# Patient Record
Sex: Female | Born: 1970
Health system: Southern US, Community
[De-identification: ages and names within clinical notes are randomized; demographics above are authoritative.]

## PROBLEM LIST (undated history)

## (undated) DIAGNOSIS — I10 Essential (primary) hypertension: Secondary | ICD-10-CM

## (undated) DIAGNOSIS — K219 Gastro-esophageal reflux disease without esophagitis: Secondary | ICD-10-CM

## (undated) DIAGNOSIS — R55 Syncope and collapse: Secondary | ICD-10-CM

## (undated) HISTORY — PX: OTHER SURGICAL HISTORY: SHX169

## (undated) HISTORY — PX: TENDON REPAIR: SHX5111

## (undated) HISTORY — DX: Syncope and collapse: R55

## (undated) HISTORY — PX: FINGER SURGERY: SHX640

## (undated) HISTORY — DX: Gastro-esophageal reflux disease without esophagitis: K21.9

---

## 2010-07-19 ENCOUNTER — Ambulatory Visit
Admission: RE | Admit: 2010-07-19 | Discharge: 2010-07-19 | Payer: Self-pay | Source: Home / Self Care | Admitting: Emergency Medicine

## 2010-07-19 DIAGNOSIS — K219 Gastro-esophageal reflux disease without esophagitis: Secondary | ICD-10-CM

## 2010-08-29 NOTE — Assessment & Plan Note (Signed)
Summary: SORE THROAT,COUGH/TJ   Vital Signs:  Patient Profile:   40 Years Old Female CC:      Cold & URI symptoms Height:     64 inches Weight:      182.50 pounds O2 Sat:      96 % O2 treatment:    Room Air Temp:     99.5 degrees F oral Pulse rate:   98 / minute Resp:     16 per minute BP sitting:   141 / 82  (left arm) Cuff size:   regular  Vitals Entered By: Clemens Catholic LPN (July 19, 2010 7:44 PM)                  Updated Prior Medication List: PRILOSEC 20 MG CPDR (OMEPRAZOLE)  TRIAMINIC COUGH/RUNNY NOSE 12.5 MG STRP (DIPHENHYDRAMINE HCL)   Current Allergies: No known allergies History of Present Illness History from: patient Chief Complaint: Cold & URI symptoms History of Present Illness: 40 Years Old Female complains of onset of cold symptoms for 3-4 days.  Tanya [name] has been using her Zantac and Triaminic which isn't helping. Usually when her throat hurts, it's from her GERD and her Zantac + antihistamine makes it better.  But this is getting worse and she has URI symptoms as well. + sore throat (first and worse symptom) + cough No pleuritic pain + mild wheezing + nasal congestion + post-nasal drainage No sinus pain/pressure No chest congestion No itchy/red eyes No earache No hemoptysis No SOB No chills/sweats + fever No nausea No vomiting No abdominal pain No diarrhea No skin rashes No fatigue No myalgias No headache   REVIEW OF SYSTEMS Constitutional Symptoms       Complains of fever, chills, and night sweats.     Denies weight loss, weight gain, and fatigue.  Eyes       Denies change in vision, eye pain, eye discharge, glasses, contact lenses, and eye surgery. Ear/Nose/Throat/Mouth       Complains of frequent runny nose, sinus problems, sore throat, and hoarseness.      Denies hearing loss/aids, change in hearing, ear pain, ear discharge, dizziness, frequent nose bleeds, and tooth pain or bleeding.  Respiratory       Denies dry cough,  productive cough, wheezing, shortness of breath, asthma, bronchitis, and emphysema/COPD.  Cardiovascular       Denies murmurs, chest pain, and tires easily with exhertion.    Gastrointestinal       Denies stomach pain, nausea/vomiting, diarrhea, constipation, blood in bowel movements, and indigestion. Genitourniary       Denies painful urination, kidney stones, and loss of urinary control. Neurological       Denies paralysis, seizures, and fainting/blackouts. Musculoskeletal       Denies muscle pain, joint pain, joint stiffness, decreased range of motion, redness, swelling, muscle weakness, and gout.  Skin       Denies bruising, unusual mles/lumps or sores, and hair/skin or nail changes.  Psych       Denies mood changes, temper/anger issues, anxiety/stress, speech problems, depression, and sleep problems. Other Comments: pt c/o sore throat, hoarseness, cough x 3 days. she has taken OTC Mucinex with no relief. she states it feels like her throat is "on fire".   Past History:  Past Medical History: GERD  Past Surgical History: Rt 4th digit tendon repair  Family History: MI, parkinson's dz, prostate CA  Social History: Never Smoked Alcohol use-no Drug use-no Smoking Status:  never Drug Use:  no  Physical Exam General appearance: well developed, well nourished, no acute distress Ears: normal, no lesions or deformities Nasal: clear discharge Oral/Pharynx: tongue normal, posterior pharynx without erythema or exudate, mild clear post nasal drip Chest/Lungs: no rales, wheezes, or rhonchi bilateral, breath sounds equal without effort Heart: regular rate and  rhythm, no murmur Skin: no obvious rashes or lesions MSE: oriented to time, place, and person Assessment New Problems: GERD (ICD-530.81) UPPER RESPIRATORY INFECTION, ACUTE (ICD-465.9)  rapid strep negative  Patient Education: Patient and/or caregiver instructed in the following: rest, fluids, Tylenol prn, Ibuprofen  prn.  Plan New Medications/Changes: AMOXICILLIN 875 MG TABS (AMOXICILLIN) 1 by mouth two times a day for 7 days  #14 x 0, 07/19/2010, Hoyt Koch MD  New Orders: New Patient Level III [phone] Solumedrol up to 125mg  [J2930] Admin of Therapeutic Inj  intramuscular or subcutaneous [96372] Planning Comments:   1)  Take the prescribed antibiotic as instructed. Hold for a few days since this illness usually peaks at day #4 2)  Use nasal saline solution (over the counter) at least 3 times a day. 3)  Use over the counter decongestants like Zyrtec-D every 12 hours as needed to help with congestion. 4)  Can take tylenol every 6 hours or motrin every 8 hours for pain or fever. 5)  Follow up with your primary doctor  if no improvement in 5-7 days, sooner if increasing pain, fever, or new symptoms.   Continue your GERD meds as needed.    The patient and/or caregiver has been counseled thoroughly with regard to medications prescribed including dosage, schedule, interactions, rationale for use, and possible side effects and they verbalize understanding.  Diagnoses and expected course of recovery discussed and will return if not improved as expected or if the condition worsens. Patient and/or caregiver verbalized understanding.  Prescriptions: AMOXICILLIN 875 MG TABS (AMOXICILLIN) 1 by mouth two times a day for 7 days  #14 x 0   Entered and Authorized by:   Hoyt Koch MD   Signed by:   Hoyt Koch MD on 07/19/2010   Method used:   Print then Give to Patient   RxID:   [id number]   Medication Administration  Injection # 1:    Medication: Solumedrol up to 125mg     Diagnosis: UPPER RESPIRATORY INFECTION, ACUTE (ICD-465.9)    Route: IM    Site: RUOQ gluteus    Exp Date: 12/27/2012    Lot #: [id number]    Mfr: Pharmacia    Patient tolerated injection without complications    Given by: Clemens Catholic LPN (July 19, 2010 8:06 PM)  Orders Added: 1)  New Patient Level III  [99203] 2)  Solumedrol up to 125mg  [J2930] 3)  Admin of Therapeutic Inj  intramuscular or subcutaneous [96372]    Laboratory Results  Date/Time Received: July 19, 2010 7:56 PM  Date/Time Reported: July 19, 2010 7:56 PM   Other Tests  Rapid Strep: negative  Kit Test Internal QC: Negative   (Normal Range: Negative)

## 2011-09-02 LAB — BASIC METABOLIC PANEL
BUN: 17 mg/dL (ref 4–21)
Glucose: 88 mg/dL
Potassium: 4 mmol/L (ref 3.4–5.3)
Sodium: 136 mmol/L — AB (ref 137–147)

## 2011-09-02 LAB — LIPID PANEL: Triglycerides: 117 mg/dL (ref 40–160)

## 2011-09-02 LAB — HEPATIC FUNCTION PANEL: Bilirubin, Total: 0.7 mg/dL

## 2012-07-14 ENCOUNTER — Encounter: Payer: Self-pay | Admitting: Physician Assistant

## 2012-07-14 ENCOUNTER — Ambulatory Visit (INDEPENDENT_AMBULATORY_CARE_PROVIDER_SITE_OTHER): Payer: 59 | Admitting: Physician Assistant

## 2012-07-14 VITALS — BP 131/79 | HR 74 | Temp 98.4°F | Resp 18 | Wt 187.0 lb

## 2012-07-14 DIAGNOSIS — Q849 Congenital malformation of integument, unspecified: Secondary | ICD-10-CM

## 2012-07-14 DIAGNOSIS — L989 Disorder of the skin and subcutaneous tissue, unspecified: Secondary | ICD-10-CM

## 2012-07-14 DIAGNOSIS — J309 Allergic rhinitis, unspecified: Secondary | ICD-10-CM

## 2012-07-14 DIAGNOSIS — L259 Unspecified contact dermatitis, unspecified cause: Secondary | ICD-10-CM

## 2012-07-14 DIAGNOSIS — J302 Other seasonal allergic rhinitis: Secondary | ICD-10-CM

## 2012-07-14 DIAGNOSIS — L309 Dermatitis, unspecified: Secondary | ICD-10-CM

## 2012-07-14 DIAGNOSIS — R238 Other skin changes: Secondary | ICD-10-CM

## 2012-07-14 DIAGNOSIS — L988 Other specified disorders of the skin and subcutaneous tissue: Secondary | ICD-10-CM

## 2012-07-14 DIAGNOSIS — M2550 Pain in unspecified joint: Secondary | ICD-10-CM

## 2012-07-14 DIAGNOSIS — M2560 Stiffness of unspecified joint, not elsewhere classified: Secondary | ICD-10-CM

## 2012-07-14 MED ORDER — CLOBETASOL PROPIONATE 0.05 % EX GEL: 1.0000 "application " | Freq: Two times a day (BID) | CUTANEOUS | Status: DC

## 2012-07-14 NOTE — Patient Instructions (Addendum)
Ibuprofen for hands and feet inflammation.   Clobetasol for spot on head. Continue using shampoo try to decrease back to twice a week.

## 2012-07-16 ENCOUNTER — Telehealth: Payer: Self-pay | Admitting: Physician Assistant

## 2012-07-16 DIAGNOSIS — L309 Dermatitis, unspecified: Secondary | ICD-10-CM | POA: Insufficient documentation

## 2012-07-16 DIAGNOSIS — J302 Other seasonal allergic rhinitis: Secondary | ICD-10-CM | POA: Insufficient documentation

## 2012-07-16 NOTE — Progress Notes (Signed)
Subjective:    Patient ID: Tanya [name], female    DOB: 04/13/1971, 41 y.o.   MRN: [id number]  HPI Patient is a 41 year old female who presents to the clinic to establish care today. Past medical history is positive for GERD and seasonal allergies.  Patient is controlled on her acid reflux and uses omeprazole 20 mg daily for heartburn relief.  Patient does have a few new issues to address today. She has a hard and not on her for head that has been in normal by other healthcare professionals for the last 2 months. The knot does not hurt and doesn't seem to be getting bigger. She does not remember this as of 2 months ago. She would just like a piece of mind knowing everything is all right.  She also complains of scaly patches on the back of her head that has been ongoing for the last year. She has been using a tar shampoo is good for psoriasis and seborrheic dermatitis. It has helped a lot with itching and symptoms. She's only stenoses the shampoo twice a week but she's having to use it daily in order to have symptomatic relief. She's never been given any prescription for these patches however over-the-counter hydrocortisone 1% cream tends to help.  She also has a lesion on her left gluteus maximus. She is noticed a spot for about 2 weeks now. It does not hurt or itch. She has not done anything to make better. Nothing seems to make worse. It appears to be going away on its own. She notices no edema or warmth coming from that area. She denies any fever, chills, muscle aches.  She's also having some hand stiffness and joint pain of bilateral hands. This has been ongoing over the course of a year. It has thickened worse in the last couple of months. She notices that stiffness is worse first thing in the morning and gets better throughout the day. She her preventative for RA. She takes Aleve occasionally for pain.  Patient's next mammogram is scheduled for later this month. Patient is aware she has not  had fasting labs in over a year and will followup later this month. Patient does not smoke and drinks occasionally.  Patient does have a family history positive for prostate cancer, heart attack, Parkinson's disease, stroke and high blood pressure.   Review of Systems     Objective:   Physical Exam  Constitutional: She is oriented to person, place, and time. She appears well-developed and well-nourished.  HENT:  Head: Normocephalic and atraumatic.  Cardiovascular: Normal rate, regular rhythm and normal heart sounds.   Pulmonary/Chest: Effort normal and breath sounds normal.  Musculoskeletal:       Normal range of motion of bilateral hands. I did not see any particular joint swelling bilaterally. Strength 5 out of 5.  Neurological: She is alert and oriented to person, place, and time.  Skin:          Harden knot about the size of a grape center of forehead.  Psychiatric: She has a normal mood and affect. Her behavior is normal.          Assessment & Plan:  GERD-we'll refill omeprazole. Continue to keep a GERD diet.  Abnormality of skin, not all 4 head-call down to radiology and they suggested a CT of the head without contrast for evaluation. Will call patient with that information. Reassured patient that I did not think findings were significant and felt like a bony cyst.  Dermatitis-it  may be because lesions have been treated but they do not appear typical of psoriasis. Will treat like atopic dermatitis. With steroid cream working I suggest we continue with that treatment and give her something a little stronger. Gave him another to apply twice a day for up to 2 weeks at a time and then as needed for skin irritation and rash. I did inform patient to continue using tar shampoo but tried to decrease down to twice a week. If worsening or not improving call office.  Lesion-days Bactroban in office to apply up to 3 times a day. Encouraged patient to keep area clean. Call if lesion  worsening or signs of infection are present.  Joint pain and stiffness-I suggested to patient that we get an RA rheumatoid panel. Patient did not want to do this testing at this time. She didn't want to give more time. I encouraged her to take Aleve or frequently for pain control. If she starts to notice a particular joint swelling we could do panel and get x-rays of hands. I did say that she could try glucosamine/chondroitin along with facial that could potentially help with some joint pain and stiffness.  Spent 45 minutes with patient in greater than 50% of the time was spent counseling of treatment plan and explaining diagnosis.

## 2012-07-16 NOTE — Patient Instructions (Addendum)
Will get CT of head to evaluate knot on forehead.   Refilled Omeprazole.  Try aleve regularly for hand pain. Call if want testing for RA.   Follow up in next 3 months for fasting labs.

## 2012-07-22 ENCOUNTER — Telehealth: Payer: Self-pay | Admitting: *Deleted

## 2012-07-22 DIAGNOSIS — Q849 Congenital malformation of integument, unspecified: Secondary | ICD-10-CM

## 2012-07-22 NOTE — Telephone Encounter (Signed)
Carolyn from [employer] called and states the order for the CT on this pt needs to be changed to w/wo

## 2012-08-19 ENCOUNTER — Encounter: Payer: Self-pay | Admitting: *Deleted

## 2012-09-15 ENCOUNTER — Ambulatory Visit (HOSPITAL_BASED_OUTPATIENT_CLINIC_OR_DEPARTMENT_OTHER): Payer: 59

## 2012-09-15 ENCOUNTER — Ambulatory Visit (HOSPITAL_BASED_OUTPATIENT_CLINIC_OR_DEPARTMENT_OTHER)
Admission: RE | Admit: 2012-09-15 | Discharge: 2012-09-15 | Disposition: A | Discharge status: Home / Self Care | Payer: 59 | Source: Ambulatory Visit | Attending: Physician Assistant | Admitting: Physician Assistant

## 2012-09-15 DIAGNOSIS — L989 Disorder of the skin and subcutaneous tissue, unspecified: Secondary | ICD-10-CM

## 2012-09-15 DIAGNOSIS — R220 Localized swelling, mass and lump, head: Secondary | ICD-10-CM | POA: Insufficient documentation

## 2012-10-06 ENCOUNTER — Ambulatory Visit (INDEPENDENT_AMBULATORY_CARE_PROVIDER_SITE_OTHER): Payer: 59 | Admitting: Physician Assistant

## 2012-10-06 ENCOUNTER — Encounter: Payer: Self-pay | Admitting: Physician Assistant

## 2012-10-06 VITALS — BP 132/78 | HR 69 | Wt 187.0 lb

## 2012-10-06 DIAGNOSIS — R030 Elevated blood-pressure reading, without diagnosis of hypertension: Secondary | ICD-10-CM

## 2012-10-06 DIAGNOSIS — M79643 Pain in unspecified hand: Secondary | ICD-10-CM

## 2012-10-06 DIAGNOSIS — M25549 Pain in joints of unspecified hand: Secondary | ICD-10-CM

## 2012-10-06 DIAGNOSIS — R21 Rash and other nonspecific skin eruption: Secondary | ICD-10-CM

## 2012-10-06 DIAGNOSIS — Z13220 Encounter for screening for lipoid disorders: Secondary | ICD-10-CM

## 2012-10-06 DIAGNOSIS — Z789 Other specified health status: Secondary | ICD-10-CM

## 2012-10-06 DIAGNOSIS — Z131 Encounter for screening for diabetes mellitus: Secondary | ICD-10-CM

## 2012-10-06 LAB — LIPID PANEL
Cholesterol: 216 mg/dL — ABNORMAL HIGH (ref 0–200)
HDL: 50 mg/dL (ref >39)
Total CHOL/HDL Ratio: 4.3 Ratio
VLDL: 30 mg/dL (ref 0–40)

## 2012-10-06 LAB — COMPREHENSIVE METABOLIC PANEL
ALT: <8 U/L (ref 0–35)
AST: 12 U/L (ref 0–37)
Alkaline Phosphatase: 60 U/L (ref 39–117)
Creat: 0.96 mg/dL (ref 0.50–1.10)
Sodium: 139 mEq/L (ref 135–145)
Total Bilirubin: 0.4 mg/dL (ref 0.3–1.2)

## 2012-10-06 LAB — CBC
MCH: 27.4 pg (ref 26.0–34.0)
MCHC: 33.4 g/dL (ref 30.0–36.0)
MCV: 82 fL (ref 78.0–100.0)
Platelets: 220 10*3/uL (ref 150–400)
RDW: 14.8 % (ref 11.5–15.5)

## 2012-10-06 NOTE — Patient Instructions (Addendum)

## 2012-10-06 NOTE — Progress Notes (Signed)
  Subjective:    Patient ID: Tanya [name], female    DOB: 07-Dec-1970, 42 y.o.   MRN: [id number]  HPI Patient presents to the clinic to follow up on last visit.   She does have elevated BP today will recheck. Denies any hx of HTN. Does not watch diet and does not exercise. She has gained some weight over the last year. Denies any CP, palpitations, SOB, HA's or blurred vision.   RASH on left buttocks crease. It is a pinpoint scaly lesions. It itches but not painful. It has not increased in size. It does not drain. She has not done anything to make better.   Joint pain/stiffiness in hands is much better. After coming in she rarely has symptoms. If she does she take ibuprofen and resolves.      Review of Systems     Objective:   Physical Exam  Constitutional: She is oriented to person, place, and time. She appears well-developed and well-nourished.  HENT:  Head: Normocephalic and atraumatic.  Eyes: Conjunctivae are normal.  Cardiovascular: Normal rate, regular rhythm and normal heart sounds.   Pulmonary/Chest: Effort normal and breath sounds normal.  Neurological: She is alert and oriented to person, place, and time.  Skin: Skin is warm and dry.  Pinpoint red fine scaly lesion about 1mm on left buttocks crease.   Psychiatric: She has a normal mood and affect. Her behavior is normal.          Assessment & Plan:  Elevated blood pressure reading- Rechecked and was 132/78. Discussed low salt diet and gave handout. Regular exercise and weight loss could also help prevent HTN.  RASH- I originally thought it was a mild case of folliculitis and gave batroban. It almost seems like today that it could be a actinic keratosis. Offered to freeze today but pt declined. Also told her to use temovate on area and see if it might be some atopic dermatitis. Does not seem worrisome. Discussed to keep a watch for changes if AK could turn into Basal Cell. Did refill cream for scalp dermatitis.  Joint  stiffness of hands- REsolved with occasional naproxen. Declined further work up.   Gave lab slip for screening labs.

## 2012-10-13 ENCOUNTER — Telehealth: Payer: Self-pay | Admitting: *Deleted

## 2012-10-13 NOTE — Telephone Encounter (Signed)
Pt notified of labs

## 2012-12-21 NOTE — Telephone Encounter (Signed)
.  nn

## 2012-12-26 ENCOUNTER — Encounter: Payer: Self-pay | Admitting: *Deleted

## 2012-12-26 ENCOUNTER — Emergency Department
Admission: EM | Admit: 2012-12-26 | Discharge: 2012-12-26 | Disposition: A | Discharge status: Home / Self Care | Payer: 59 | Source: Home / Self Care | Attending: Family Medicine | Admitting: Family Medicine

## 2012-12-26 DIAGNOSIS — J029 Acute pharyngitis, unspecified: Secondary | ICD-10-CM

## 2012-12-26 LAB — POCT RAPID STREP A (OFFICE): Rapid Strep A Screen: NEGATIVE

## 2012-12-26 MED ORDER — AZITHROMYCIN 250 MG PO TABS: ORAL_TABLET | ORAL | Status: DC

## 2012-12-26 MED ORDER — BENZONATATE 200 MG PO CAPS: 200.0000 mg | ORAL_CAPSULE | Freq: Every day | ORAL | Status: DC

## 2012-12-26 NOTE — ED Notes (Signed)
Patient c/o sore throat x 3-4 days and c/o left ear pain, fever, chills. Patient has tried OTC Mucinex, Hall cough drops, and Chloraseptic throat lozenges with no relief.

## 2012-12-26 NOTE — ED Provider Notes (Signed)
History     CSN: [id number]  Arrival date & time 12/26/12  1615   First MD Initiated Contact with Patient 12/26/12 1658      Chief Complaint  Patient presents with  . Sore Throat      HPI Comments: Patient complains of approximately 4 day history of gradually progressive URI symptoms beginning with a mild sore throat (now improved), followed by nasal congestion.  A cough started 2 days ago.  Complains of fatigue and initial myalgias.  Cough is now worse at night and generally non-productive during the day.  There has been no pleuritic pain, shortness of breath, or wheezes.   She has had chills.  The history is provided by the patient.    Past Medical History  Diagnosis Date  . GERD (gastroesophageal reflux disease)     Past Surgical History  Procedure Laterality Date  . Tendon repair      RT 4th digit    Family History  Problem Relation Age of Onset  . Cancer Father     prostate  . Heart attack Maternal Grandmother   . Hypertension Maternal Grandmother   . Parkinson's disease Maternal Grandfather     History  Substance Use Topics  . Smoking status: Never Smoker   . Smokeless tobacco: Never Used  . Alcohol Use: Yes    OB History   Grav Para Term Preterm Abortions TAB SAB Ect Mult Living                  Review of Systems + sore throat + cough No pleuritic pain No wheezing No nasal congestion + post-nasal drainage No sinus pain/pressure No itchy/red eyes ? earache No hemoptysis No SOB No fever, + chills No nausea No vomiting No abdominal pain No diarrhea No urinary symptoms No skin rashes + fatigue + myalgias No headache    Allergies  Review of patient's allergies indicates no known allergies.  Home Medications   Current Outpatient Rx  Name  Route  Sig  Dispense  Refill  . azithromycin (ZITHROMAX Z-PAK) 250 MG tablet      Take 2 tabs today; then begin one tab once daily for 4 more days. (Rx void after 01/02/13)   6 each   0   .  benzonatate (TESSALON) 200 MG capsule   Oral   Take 1 capsule (200 mg total) by mouth at bedtime.   12 capsule   0   . brompheniramine-pseudoephedrine (DIMETAPP) 1-15 MG/5ML ELIX   Oral   Take by mouth 2 (two) times daily as needed.         . calcium carbonate (TUMS EX) 750 MG chewable tablet   Oral   Chew 1 tablet by mouth as needed.         . clobetasol (TEMOVATE) 0.05 % GEL   Topical   Apply 1 application topically 2 (two) times daily. For no longer than 2 weeks at a time.   1 each   1   . naproxen sodium (ANAPROX) 220 MG tablet   Oral   Take 220 mg by mouth as needed.         Marland Kitchen omeprazole (PRILOSEC) 20 MG capsule   Oral   Take 20 mg by mouth as needed.           BP 113/74  Pulse 84  Temp(Src) 98.1 F (36.7 C) (Oral)  Resp 16  Ht 5\' 4"  (1.626 m)  Wt 189 lb 12.8 oz (86.093 kg)  BMI  32.56 kg/m2  SpO2 99%  Physical Exam Nursing notes and Vital Signs reviewed. Appearance:  Patient appears stated age, and in no acute distress.  Patient is obese (BMI 32.6) Eyes:  Pupils are equal, round, and reactive to light and accomodation.  Extraocular movement is intact.  Conjunctivae are not inflamed  Ears:  Canals normal.  Tympanic membranes normal.  Nose:  Mildly congested turbinates.  No sinus tenderness.   Pharynx:  Minimal erythema Neck:  Supple.  Slightly tender shotty posterior nodes are palpated bilaterally  Lungs:  Clear to auscultation.  Breath sounds are equal.  Heart:  Regular rate and rhythm without murmurs, rubs, or gallops.  Abdomen:  Nontender without masses or hepatosplenomegaly.  Bowel sounds are present.  No CVA or flank tenderness.  Extremities:  No edema.  No calf tenderness Skin:  No rash present.   ED Course  Procedures  none  Labs Reviewed  STREP A DNA PROBE  POCT RAPID STREP A (OFFICE) negative      1. Acute pharyngitis; suspect early viral URI       MDM  There is no evidence of bacterial infection today.   Treat symptomatically  for now.  Throat culture pending. Take Mucinex D (guaifenesin with decongestant) twice daily for congestion.  Increase fluid intake, rest. If sinus congestion increases, may use Afrin nasal spray (or generic oxymetazoline) twice daily for about 5 days.  Also recommend using saline nasal spray several times daily and saline nasal irrigation (AYR is a common brand) Stop all antihistamines for now, and other non-prescription cough/cold preparations. May take Aleve, 2 tabs twice daily with food for sore throat, fever, etc. Begin Benzonatate if cough becomes worse at night. Begin Azithromycin if throat culture positive, if not improving about one week, or if persistent fever develops (Given a prescription to hold, with an expiration date)  Follow-up with family doctor if not improving about10 days.         Lattie Haw, MD 12/28/12 1028

## 2013-10-04 ENCOUNTER — Encounter: Payer: Self-pay | Admitting: Family Medicine

## 2013-10-05 ENCOUNTER — Ambulatory Visit (INDEPENDENT_AMBULATORY_CARE_PROVIDER_SITE_OTHER): Payer: 59 | Admitting: Physician Assistant

## 2013-10-05 ENCOUNTER — Encounter: Payer: Self-pay | Admitting: Physician Assistant

## 2013-10-05 VITALS — BP 130/82 | HR 70 | Wt 197.0 lb

## 2013-10-05 DIAGNOSIS — H16139 Photokeratitis, unspecified eye: Secondary | ICD-10-CM

## 2013-10-05 DIAGNOSIS — L570 Actinic keratosis: Secondary | ICD-10-CM

## 2013-10-05 NOTE — Patient Instructions (Signed)
Actinic Keratosis  Actinic keratosis is a precancerous growth on the skin. This means it could develop into skin cancer if it is not treated. About 1% of actinic keratoses turn into skin cancer within a year. It is important to have all such growths removed to prevent them from developing into skin cancer.  CAUSES   Actinic keratosis is caused by getting too much ultraviolet (UV) radiation from the sun or other UV light sources.  RISK FACTORS  Factors that increase your chances of getting actinic keratosis include:   Having light-colored skin and blue eyes.   Having blonde or red hair.   Spending a lot of time in the sun.   Age. The risk of actinic keratosis increases with age.  SYMPTOMS   Actinic keratosis growths look like scaly, rough spots of skin. They can be as small as a pinhead or as big as a quarter. They may itch, hurt, or feel sensitive. Sometimes there is a little tag of pink or gray skin growing off them. In some cases, actinic keratoses are easier felt than seen. They do not go away with the use of moisturizing lotions or creams. Actinic keratoses appear most often on areas of skin that get a lot of sun exposure. These areas include the:   Scalp.   Face.   Ears.   Lips.   Upper back.   Backs of the hands.   Forearms.  DIAGNOSIS   Your caregiver can usually tell what is wrong by performing a physical exam. A tissue sample (biopsy) may also be taken and examined under a microscope.  TREATMENT   Actinic keratosis can be treated several ways. Most treatments can be done in your caregiver's office. Treatment options may include:   Curettage. A tool is used to gently scrape off the growth.   Cryosurgery. Liquid nitrogen is applied to the growth to freeze it. The growth eventually falls off the skin.   Medicated creams, such as 5-fluorouracil or imiquimod. The medicine destroys the cells in the growth.    Chemical peels. Chemicals are applied to the growth and the outer layers of skin are peeled off.   Photodynamic therapy. A drug that makes your skin more sensitive to light is applied to the skin. A strong, blue light is aimed at the skin and destroys the growth.  PREVENTION   To prevent future sun damage:   Try to avoid the sun between 10:00 a.m. and 4:00 p.m. when it is the strongest.   Use a sunscreen or sunblock with SPF 30 or greater.   Apply sunscreen at least 30 minutes before exposure to the sun.   Always wear protective hats, clothing, and sunglasses with UV protection.   Avoid medicines, herbs, and foods that increase your sensitivity to sunlight.   Avoid tanning beds.  HOME CARE INSTRUCTIONS    If your skin was covered with a bandage, change and remove the bandage as directed by your caregiver.   Keep the treated area dry as directed by your caregiver.   Apply any creams as prescribed by your caregiver. Follow the directions carefully.   Check your skin regularly for any changes.   Visit a skin doctor (dermatologist) every year for a skin exam.  SEEK MEDICAL CARE IF:    Your skin does not heal and becomes irritated, red, or bleeds.   You notice any changes or new growths on your skin.  Document Released: 10/10/2008 Document Revised: 10/06/2011 Document Reviewed: 08/25/2011  [employer] Patient Information 

## 2013-10-07 NOTE — Progress Notes (Signed)
   Subjective:    Patient ID: Tanya [name], female    DOB: 05-28-1971, 43 y.o.   MRN: [id number]  HPI  patient is a 43 year old female who presents to the clinic with a wide crusty-like lesion on the middle of her chest. She does notice it for couple months and does not seem to go away. Does not itch it does not hurt. She's tried a little bit hydrocortisone cream that has not helped. She feels like she might to scratch it off but it does not seem to want to home off. She does admit to a lot of sun exposure throughout her years of life. She is fair skinned.   Review of Systems     Objective:   Physical Exam  Pulmonary/Chest:            Assessment & Plan:  Actinic keratosis-patient is very fair skinned woman in lesion is in a very sun exposed area. Physical exam was suggestive of AK. Discussed with patient the diagnosis in pre-cancerous lesion. We decided on removal with cryotherapy today. Patient was advised to come in to office if she develops any more lesion similar to this. Cryotherapy Procedure Note  Pre-operative Diagnosis: Actinic keratosis  Post-operative Diagnosis: Actinic keratosis  Locations: middle chest  Indications: pre-cancerous  Anesthesia: none  Procedure Details  History of allergy to iodine: no. Pacemaker? no.  Patient informed of risks (permanent scarring, infection, light or dark discoloration, bleeding, infection, weakness, numbness and recurrence of the lesion) and benefits of the procedure and verbal informed consent obtained.  The areas are treated with liquid nitrogen therapy, frozen until ice ball extended 2 mm beyond lesion, allowed to thaw, and treated again. The patient tolerated procedure well.  The patient was instructed on post-op care, warned that there may be blister formation, redness and pain. Recommend OTC analgesia as needed for pain.  Condition: Stable  Complications: none.  Plan: 1. Instructed to keep the area dry and covered  for 24-48h and clean thereafter. 2. Warning signs of infection were reviewed.   3. Recommended that the patient use OTC acetaminophen as needed for pain.

## 2014-04-14 ENCOUNTER — Encounter: Payer: Self-pay | Admitting: Physician Assistant

## 2014-04-14 ENCOUNTER — Ambulatory Visit (INDEPENDENT_AMBULATORY_CARE_PROVIDER_SITE_OTHER): Payer: 59 | Admitting: Physician Assistant

## 2014-04-14 ENCOUNTER — Ambulatory Visit (INDEPENDENT_AMBULATORY_CARE_PROVIDER_SITE_OTHER): Payer: 59

## 2014-04-14 VITALS — BP 128/67 | HR 83 | Ht 64.0 in | Wt 187.0 lb

## 2014-04-14 DIAGNOSIS — M6530 Trigger finger, unspecified finger: Secondary | ICD-10-CM

## 2014-04-14 DIAGNOSIS — M79609 Pain in unspecified limb: Secondary | ICD-10-CM

## 2014-04-14 DIAGNOSIS — M65312 Trigger thumb, left thumb: Secondary | ICD-10-CM

## 2014-04-14 MED ORDER — MELOXICAM 15 MG PO TABS: 15.0000 mg | ORAL_TABLET | Freq: Every day | ORAL | Status: DC

## 2014-04-14 NOTE — Progress Notes (Signed)
   Subjective:    Patient ID: Tanya [name], female    DOB: 26-Aug-1970, 43 y.o.   MRN: [id number]  HPI Pt presents to the clinic with left thumb pain and loss of strength and ROM over past 3 months. Started in July 2015. Left thumb would ache off and on. Now she cannot bend her thumb and if she does it gets caught. She works with her hands a lot. She has to work this weekend but off a few days next week.    Review of Systems  [name] other systems reviewed and are negative.      Objective:   Physical Exam  Constitutional: She appears well-developed and well-nourished.  Musculoskeletal:  No ROM at IP joint of left thumb.  Some side to side movement of MCP joint.  Pain with harden know at base of MCP joint.  No able to reproduce trigger actively but passively elicict a lot of pain and catching.           Assessment & Plan:  Trigger thumb, left- Dr. Darene [name] not in office today. Scheduled for injection on Monday. Xray ordered today. Offered to splint but pt has to work and states she will not stay in splint. Ordered mobic daily. Encouraged to ice. Gave HO.

## 2014-04-14 NOTE — Patient Instructions (Signed)
Trigger Finger Trigger finger (digital tendinitis and stenosing tenosynovitis) is a common disorder that causes an often painful catching of the fingers or thumb. It occurs as a clicking, snapping, or locking of a finger in the palm of the hand. This is caused by a problem with the tendons that flex or bend the fingers sliding smoothly through their sheaths. The condition may occur in any finger or a couple fingers at the same time.  The finger may lock with the finger curled or suddenly straighten out with a snap. This is more common in patients with rheumatoid arthritis and diabetes. Left untreated, the condition may get worse to the point where the finger becomes locked in flexion, like making a fist, or less commonly locked with the finger straightened out. CAUSES   Inflammation and scarring that lead to swelling around the tendon sheath.  Repeated or forceful movements.  Rheumatoid arthritis, an autoimmune disease that affects joints.  Gout.  Diabetes mellitus. SIGNS AND SYMPTOMS  Soreness and swelling of your finger.  A painful clicking or snapping as you bend and straighten your finger. DIAGNOSIS  Your health care provider will do a physical exam of your finger to diagnose trigger finger. TREATMENT   Splinting for 6-8 weeks may be helpful.  Nonsteroidal anti-inflammatory medicines (NSAIDs) can help to relieve the pain and inflammation.  Cortisone injections, along with splinting, may speed up recovery. Several injections may be required. Cortisone may give relief after one injection.  Surgery is another treatment that may be used if conservative treatments do not work. Surgery can be minor, without incisions (a cut does not have to be made), and can be done with a needle through the skin.  Other surgical choices involve an open procedure in which the surgeon opens the hand through a small incision and cuts the pulley so the tendon can again slide smoothly. Your hand will still  work fine. HOME CARE INSTRUCTIONS  Apply ice to the injured area, twice per day:  Put ice in a plastic bag.  Place a towel between your skin and the bag.  Leave the ice on for 20 minutes, 3-4 times a day.  Rest your hand often. MAKE SURE YOU:   Understand these instructions.  Will watch your condition.  Will get help right away if you are not doing well or get worse. Document Released: 05/03/2004 Document Revised: 03/16/2013 Document Reviewed: 12/14/2012 Exit[employer] Patient Information 2015 [employer]. This information is not intended to replace advice given to you by your health care provider. Make sure you discuss any questions you have with your health care provider.  

## 2014-04-17 ENCOUNTER — Ambulatory Visit (INDEPENDENT_AMBULATORY_CARE_PROVIDER_SITE_OTHER): Payer: 59 | Admitting: Sports Medicine

## 2014-04-17 ENCOUNTER — Encounter: Payer: Self-pay | Admitting: Sports Medicine

## 2014-04-17 VITALS — BP 138/78 | HR 70 | Ht 64.0 in | Wt 188.0 lb

## 2014-04-17 DIAGNOSIS — M65312 Trigger thumb, left thumb: Secondary | ICD-10-CM | POA: Insufficient documentation

## 2014-04-17 DIAGNOSIS — M6530 Trigger finger, unspecified finger: Secondary | ICD-10-CM

## 2014-04-17 NOTE — Assessment & Plan Note (Signed)
Ultrasound-guided tendon sheath injection as above. She was able to take the thumb through full range of motion after the injection. This is one of the tightest trigger thumbs I've ever seen, we can actually see the tendon bunching up under the A1 pulley at the metacarpal phalangeal joint under the sesamoid. If persistent symptoms, I will bring her back in one month, and I will transect the A1 pulley with the tip of the needle under ultrasound guidance.

## 2014-04-17 NOTE — Progress Notes (Signed)
Patient ID: Tanya [name], female   DOB: Mar 11, 1971, 43 y.o.   MRN: [id number]   Subjective:    I'm seeing this patient as a consultation for: [place]  CC: Left thumb pain and immobility  HPI: Tanya [name] is a very pleasant 43 year old female who presents with 3 months of an inability to actively flex her left thumb at the interphalangeal joint. This immobility has been insidious in onset, starting as slight limitation with "catching" and progressing to complete immobility within the past week. She is able to passively flex the thumb with use of her opposite hand. Also reports simultaneous onset of soreness about the palmar side of the thumb metacarpophalangeal joint. Imaging done in our office three days ago (9/18) did reveal degenerative changes of the IP joint. She has tried using Meloxicam once, which provided minimal pain relief. No history of trauma.  Past medical history, Surgical history, Family history not pertinant except as noted below, Social history, Allergies, and medications have been entered into the medical record, reviewed, and no changes needed.   Review of Systems: No joint swelling, body aches, or weight changes.  Objective:   General: Well developed, well nourished, and in no acute distress.  Neuro/Psych: Alert and oriented x3, extra-ocular muscles intact, able to move all 4 extremities, sensation grossly intact. Skin: Warm and dry, no rashes noted.  Respiratory: Not using accessory muscles, speaking in full sentences, trachea midline.  Cardiovascular: Pulses palpable, no extremity edema. Abdomen: Does not appear distended. Left Thumb:  Thumb rests in full extension with no ability to actively flex at the IP joint. Full passive ROM at the IP joint, but this motion is painful. Limited active opposition. Full passive ROM at the MCP joint. Tender nodule present at the MCP joint.  Procedure: Real-time Ultrasound Guided Injection of left flexor pollicis longus tendon  sheath Device: GE Logiq E  Verbal informed consent obtained.  Time-out conducted.  Noted no overlying erythema, induration, or other signs of local infection.  Skin prepped in a sterile fashion.  Local anesthesia: Topical Ethyl chloride.  With sterile technique and under real time ultrasound guidance:  I was clearly able to see a tight A1 pulley, and bunching up of the tendon proximal to the pulley. I injected 1 cc kenalog 40, 2 cc lidocaine into the tendon sheath. Completed without difficulty  Pain immediately resolved suggesting accurate placement of the medication.  Advised to call if fevers/chills, erythema, induration, drainage, or persistent bleeding.  Images permanently stored and available for review in the ultrasound unit.  Impression: Technically successful ultrasound guided injection.  Impression and Recommendations:   This case required medical decision making of moderate complexity. Left Thumb Pain: Stenosing flexor tenosynovitis is the most likely diagnosis in this patient with history of "catching" of the thumb, progressively limited mobility, and pain at the base of the thumb and at the IP joint. This is further suggested by her ability to force the thumb through the total range of motion using her opposite hand. - Ultrasound-guided tendon sheath injection was done in the office today. She was able to take the thumb through the full range of motion after injection, although the thumb did continue to catch. Under ultrasound guidance, we were able to visualize the tendon bunching at the A1 pulley at the metacarpophalangeal joint. - Return in one month, with plan to transect the A1 pulley under ultrasound guidance if symptoms persist.

## 2014-04-17 NOTE — Progress Notes (Deleted)
   Subjective:    I'm seeing this patient as a consultation for:    CC:   HPI:  Past medical history, Surgical history, Family history not pertinant except as noted below, Social history, Allergies, and medications have been entered into the medical record, reviewed, and no changes needed.   Review of Systems: No headache, visual changes, nausea, vomiting, diarrhea, constipation, dizziness, abdominal pain, skin rash, fevers, chills, night sweats, weight loss, swollen lymph nodes, body aches, joint swelling, muscle aches, chest pain, shortness of breath, mood changes, visual or auditory hallucinations.   Objective:   General: Well Developed, well nourished, and in no acute distress.  Neuro/Psych: Alert and oriented x3, extra-ocular muscles intact, able to move all 4 extremities, sensation grossly intact. Skin: Warm and dry, no rashes noted.  Respiratory: Not using accessory muscles, speaking in full sentences, trachea midline.  Cardiovascular: Pulses palpable, no extremity edema. Abdomen: Does not appear distended.   Impression and Recommendations:   This case required medical decision making of moderate complexity.  

## 2014-05-15 ENCOUNTER — Ambulatory Visit (INDEPENDENT_AMBULATORY_CARE_PROVIDER_SITE_OTHER): Payer: 59 | Admitting: Sports Medicine

## 2014-05-15 ENCOUNTER — Encounter: Payer: Self-pay | Admitting: Sports Medicine

## 2014-05-15 VITALS — BP 120/67 | HR 71 | Ht 64.0 in | Wt 188.0 lb

## 2014-05-15 DIAGNOSIS — M65312 Trigger thumb, left thumb: Secondary | ICD-10-CM

## 2014-05-15 NOTE — Assessment & Plan Note (Signed)
Clinically resolved. Return as needed, I am happy to reinject or we can try a percutaneous A1 pulley transection under ultrasound guidance.

## 2014-05-15 NOTE — Progress Notes (Signed)
  Subjective:    CC: Followup  HPI: Trigger thumb: Excellent response to ultrasound-guided flexor pollicis longus tendon sheath injection at the last visit, both pain and mechanical triggering have resolved.  Past medical history, Surgical history, Family history not pertinant except as noted below, Social history, Allergies, and medications have been entered into the medical record, reviewed, and no changes needed.   Review of Systems: No fevers, chills, night sweats, weight loss, chest pain, or shortness of breath.   Objective:    General: Well Developed, well nourished, and in no acute distress.  Neuro: Alert and oriented x3, extra-ocular muscles intact, sensation grossly intact.  HEENT: Normocephalic, atraumatic, pupils equal round reactive to light, neck supple, no masses, no lymphadenopathy, thyroid nonpalpable.  Skin: Warm and dry, no rashes. Cardiac: Regular rate and rhythm, no murmurs rubs or gallops, no lower extremity edema.  Respiratory: Clear to auscultation bilaterally. Not using accessory muscles, speaking in full sentences. Left hand: I am still able to palpate the flexor tendon nodule, she has no pain, and no visible or palpable triggering.  Impression and Recommendations:

## 2014-07-10 ENCOUNTER — Encounter: Payer: Self-pay | Admitting: Physician Assistant

## 2014-07-10 ENCOUNTER — Ambulatory Visit (INDEPENDENT_AMBULATORY_CARE_PROVIDER_SITE_OTHER): Payer: 59 | Admitting: Physician Assistant

## 2014-07-10 VITALS — BP 150/71 | HR 90 | Ht 64.0 in | Wt 186.0 lb

## 2014-07-10 DIAGNOSIS — J029 Acute pharyngitis, unspecified: Secondary | ICD-10-CM

## 2014-07-10 MED ORDER — PREDNISONE 50 MG PO TABS: ORAL_TABLET | ORAL | Status: DC

## 2014-07-10 NOTE — Patient Instructions (Signed)
Allegra D daily.  Prednisone for 5 days.

## 2014-07-10 NOTE — Progress Notes (Signed)
   Subjective:    Patient ID: Tanya [name], female    DOB: 1970/12/08, 43 y.o.   MRN: [id number]  HPI  Patient is a 43 year old female who presents to the clinic with sore throat and often on ear pain. Sore throat is mainly on her left side. She denies any fever, chills, nausea, vomiting, sinus pressure, cough, shortness of breath or wheezing. She did try Allegra for a few days did not seem to help significantly. She did try increasing her Nexium it also did not seem to help.    Review of Systems  All other systems reviewed and are negative.      Objective:   Physical Exam  Constitutional: She is oriented to person, place, and time. She appears well-developed and well-nourished.  HENT:  Head: Normocephalic and atraumatic.  Right Ear: External ear normal.  Left Ear: External ear normal.  Nose: Nose normal.  Mouth/Throat: Oropharynx is clear and moist.  Eyes:  Bilateral conjunctiva to appear a Little erythematous.  Neck: Normal range of motion. Neck supple.  Cardiovascular: Normal rate, regular rhythm and normal heart sounds.   Pulmonary/Chest: Effort normal and breath sounds normal. She has no wheezes.  Lymphadenopathy:    She has no cervical adenopathy.  Neurological: She is alert and oriented to person, place, and time.  Skin: Skin is dry.  Psychiatric: She has a normal mood and affect. Her behavior is normal.          Assessment & Plan:  Acute pharyngitis- I do not see any signs of bacterial infection today. I discussed likely there is some allergy component. Restart Allegra-D daily as well as added prednisone for 5 days. Continue symptomatic care with salt water gargles. Follow-up as needed.

## 2014-09-20 ENCOUNTER — Encounter: Payer: Self-pay | Admitting: Family Medicine

## 2015-09-23 DIAGNOSIS — S82851A Displaced trimalleolar fracture of right lower leg, initial encounter for closed fracture: Secondary | ICD-10-CM | POA: Diagnosis not present

## 2015-09-23 DIAGNOSIS — R112 Nausea with vomiting, unspecified: Secondary | ICD-10-CM | POA: Diagnosis not present

## 2015-09-23 DIAGNOSIS — Z6831 Body mass index (BMI) 31.0-31.9, adult: Secondary | ICD-10-CM | POA: Diagnosis not present

## 2015-09-23 DIAGNOSIS — M25571 Pain in right ankle and joints of right foot: Secondary | ICD-10-CM | POA: Diagnosis not present

## 2015-09-23 DIAGNOSIS — S82899A Other fracture of unspecified lower leg, initial encounter for closed fracture: Secondary | ICD-10-CM | POA: Diagnosis not present

## 2015-09-23 DIAGNOSIS — R42 Dizziness and giddiness: Secondary | ICD-10-CM | POA: Diagnosis not present

## 2015-09-23 DIAGNOSIS — E669 Obesity, unspecified: Secondary | ICD-10-CM | POA: Diagnosis not present

## 2015-09-23 DIAGNOSIS — Z01818 Encounter for other preprocedural examination: Secondary | ICD-10-CM | POA: Diagnosis not present

## 2015-09-23 DIAGNOSIS — R55 Syncope and collapse: Secondary | ICD-10-CM | POA: Diagnosis not present

## 2015-09-23 DIAGNOSIS — R531 Weakness: Secondary | ICD-10-CM | POA: Diagnosis not present

## 2015-09-23 DIAGNOSIS — K219 Gastro-esophageal reflux disease without esophagitis: Secondary | ICD-10-CM | POA: Diagnosis not present

## 2015-09-23 DIAGNOSIS — S82891A Other fracture of right lower leg, initial encounter for closed fracture: Secondary | ICD-10-CM | POA: Diagnosis not present

## 2015-09-23 DIAGNOSIS — S8251XA Displaced fracture of medial malleolus of right tibia, initial encounter for closed fracture: Secondary | ICD-10-CM | POA: Diagnosis not present

## 2015-09-23 DIAGNOSIS — T148 Other injury of unspecified body region: Secondary | ICD-10-CM | POA: Diagnosis not present

## 2015-09-23 DIAGNOSIS — R0602 Shortness of breath: Secondary | ICD-10-CM | POA: Diagnosis not present

## 2015-09-23 DIAGNOSIS — Z79899 Other long term (current) drug therapy: Secondary | ICD-10-CM | POA: Diagnosis not present

## 2015-10-12 DIAGNOSIS — Z9889 Other specified postprocedural states: Secondary | ICD-10-CM | POA: Diagnosis not present

## 2015-10-12 DIAGNOSIS — S82851D Displaced trimalleolar fracture of right lower leg, subsequent encounter for closed fracture with routine healing: Secondary | ICD-10-CM | POA: Diagnosis not present

## 2015-11-02 DIAGNOSIS — M7989 Other specified soft tissue disorders: Secondary | ICD-10-CM | POA: Diagnosis not present

## 2015-11-02 DIAGNOSIS — M25571 Pain in right ankle and joints of right foot: Secondary | ICD-10-CM | POA: Diagnosis not present

## 2015-11-02 DIAGNOSIS — S82851D Displaced trimalleolar fracture of right lower leg, subsequent encounter for closed fracture with routine healing: Secondary | ICD-10-CM | POA: Diagnosis not present

## 2015-11-30 DIAGNOSIS — S82851D Displaced trimalleolar fracture of right lower leg, subsequent encounter for closed fracture with routine healing: Secondary | ICD-10-CM | POA: Diagnosis not present

## 2015-12-11 ENCOUNTER — Other Ambulatory Visit: Payer: Self-pay

## 2015-12-11 NOTE — Patient Outreach (Signed)
[facility] screeing: Reviewed medical record. Placed call to patient who answered and identified herself. Patient reports that she had a syncopal episodes in bathroom and fell and broke her ankle. Reports that she lives in [place] and was taking to the closest hospital which was [facility].   Patient reports that she was admitted for 3 days and had a plate and screw put in ankle. Patient reports that she is recovering without problems. States that it is just slow.  Patient is aware of pharmacy benefits the [facility] health. Denies any other needs at this time.  Plan: will close case as no needs identified. Offered support and listening over the phone. Encouraged patient to call for any needs identified at a later date. Will notify [facility] to close case.  Tomasa Rand, RN, BSN, CEN [facility] [employer] [phone]

## 2016-01-10 DIAGNOSIS — S82851D Displaced trimalleolar fracture of right lower leg, subsequent encounter for closed fracture with routine healing: Secondary | ICD-10-CM | POA: Diagnosis not present

## 2016-01-10 DIAGNOSIS — S82891D Other fracture of right lower leg, subsequent encounter for closed fracture with routine healing: Secondary | ICD-10-CM | POA: Diagnosis not present

## 2016-03-21 DIAGNOSIS — S82851D Displaced trimalleolar fracture of right lower leg, subsequent encounter for closed fracture with routine healing: Secondary | ICD-10-CM | POA: Diagnosis not present

## 2016-03-21 DIAGNOSIS — Z967 Presence of other bone and tendon implants: Secondary | ICD-10-CM | POA: Diagnosis not present

## 2016-03-21 DIAGNOSIS — Z8781 Personal history of (healed) traumatic fracture: Secondary | ICD-10-CM | POA: Diagnosis not present

## 2016-06-14 ENCOUNTER — Emergency Department
Admission: EM | Admit: 2016-06-14 | Discharge: 2016-06-14 | Disposition: A | Discharge status: Home / Self Care | Payer: 59 | Source: Home / Self Care | Attending: Family Medicine | Admitting: Family Medicine

## 2016-06-14 ENCOUNTER — Encounter: Payer: Self-pay | Admitting: Emergency Medicine

## 2016-06-14 DIAGNOSIS — R55 Syncope and collapse: Secondary | ICD-10-CM

## 2016-06-14 DIAGNOSIS — R531 Weakness: Secondary | ICD-10-CM

## 2016-06-14 DIAGNOSIS — R630 Anorexia: Secondary | ICD-10-CM

## 2016-06-14 LAB — POCT CBC W AUTO DIFF (K'VILLE URGENT CARE)

## 2016-06-14 LAB — POCT FASTING CBG KUC MANUAL ENTRY: POCT Glucose (KUC): 116 mg/dL — AB (ref 70–99)

## 2016-06-14 MED ORDER — ONDANSETRON HCL 4 MG PO TABS: 4.0000 mg | ORAL_TABLET | Freq: Four times a day (QID) | ORAL | 0 refills | Status: DC

## 2016-06-14 MED ORDER — MECLIZINE HCL 25 MG PO TABS: 25.0000 mg | ORAL_TABLET | Freq: Three times a day (TID) | ORAL | 0 refills | Status: DC | PRN

## 2016-06-14 NOTE — Discharge Instructions (Signed)
°  Your EKG today was completely normal as well as your vital signs, CBC, and sugar. Additional labs called BMP, which show electrolytes has been sent to the lab and should come back in 1-2 days.  If your symptoms worsen, please do not hesitate to call 911 or go to closest emergency department for further evaluation as they do get most labs back the same day.  It is important you continue to work with your Primary Care Provider to help keep working up why your are having these symptoms. You may need a sleep study or additional tests to help determine why you keep having these episodes.

## 2016-06-14 NOTE — ED Triage Notes (Signed)
Pt c/o getting up this am w/nausea, feeling faint this am, has happened in the past 2 times, possible dehydrated, feeling fatigue.

## 2016-06-14 NOTE — ED Provider Notes (Signed)
CSN: [id number]     Arrival date & time 06/14/16  [id number] History   First MD Initiated Contact with Patient 06/14/16 0945     Chief Complaint  Patient presents with  . Near Syncope   (Consider location/radiation/quality/duration/timing/severity/associated sxs/prior Treatment) HPI  Tanya [name] is a 45 y.o. female presenting to UC with c/o waking up this morning with nausea and feeling faint. She reports this happened 2 other times this year, both in the morning shortly after waking up.  The first time this occurred, she woke up, took a shower and started to feel faint in the shower.  She then vomited multiple times but assumed it was a stomach virus so she did not go to her PCP for evaluation as symptoms eventually resolved on their own.  Last time, she passed out in her bathroom, twisted and severely broke her ankle.  They did a head CT last time, which was normal. It was believed she passed out due to dehydration.  Pt denies passing out today but notes she had to drive to work with the window down to help keep herself from vomiting.  She states she felt like she got a good night sleep but she "just does not feel good" today.  Denies nausea now but states it feels like her stomach is grumbling, but she does not feel hungry.  Denies headache to dizziness but feels slightly lightheaded. No prior hx of vertigo. Denies chest pain or palpitations. Denies SOB. Denies recent URI symptoms. Denies hx of diabetes. Denies change in medications.   Past Medical History:  Diagnosis Date  . GERD (gastroesophageal reflux disease)    Past Surgical History:  Procedure Laterality Date  . FINGER SURGERY    . TENDON REPAIR     RT 4th digit  . tri mallear     Family History  Problem Relation Age of Onset  . Cancer Father     prostate  . Heart attack Maternal Grandmother   . Hypertension Maternal Grandmother   . Parkinson's disease Maternal Grandfather    Social History  Substance Use Topics  . Smoking  status: Never Smoker  . Smokeless tobacco: Never Used  . Alcohol use Yes   OB History    No data available     Review of Systems  Constitutional: Positive for activity change, appetite change and fatigue. Negative for chills, diaphoresis, fever and unexpected weight change.  Respiratory: Negative for chest tightness and shortness of breath.   Cardiovascular: Negative for chest pain, palpitations and leg swelling.  Gastrointestinal: Positive for nausea. Negative for abdominal pain, constipation, diarrhea and vomiting.  Genitourinary: Negative for dysuria, flank pain, frequency, hematuria and urgency.  Neurological: Positive for weakness (generalized) and light-headedness. Negative for dizziness, seizures, syncope, numbness and headaches.    Allergies  Patient has no known allergies.  Home Medications   Prior to Admission medications   Medication Sig Start Date End Date Taking? Authorizing Provider  Calcium Carbonate (CALCIUM 600 PO) Take by mouth.   Yes Historical Provider, MD  Cholecalciferol (VITAMIN D) 2000 units CAPS Take by mouth.   Yes Historical Provider, MD  Naproxen Sodium (ALEVE PO) Take by mouth.   Yes Historical Provider, MD  omeprazole (PRILOSEC) 20 MG capsule Take 20 mg by mouth daily.   Yes Historical Provider, MD  calcium carbonate (TUMS EX) 750 MG chewable tablet Chew 1 tablet by mouth as needed.    Historical Provider, MD  meclizine (ANTIVERT) 25 MG tablet Take 1 tablet (25  mg total) by mouth 3 (three) times daily as needed for dizziness. 06/14/16   Noland Fordyce, PA-C  ondansetron (ZOFRAN) 4 MG tablet Take 1 tablet (4 mg total) by mouth every 6 (six) hours. 06/14/16   Noland Fordyce, PA-C   Meds Ordered and Administered this Visit  Medications - No data to display  BP 132/79 (BP Location: Left Arm)   Pulse 73   Temp 98 F (36.7 C) (Oral)   Ht 5\' 4"  (1.626 m)   Wt 185 lb (83.9 kg)   SpO2 97%   BMI 31.76 kg/m  Orthostatic VS for the past 24 hrs:  BP- Lying  Pulse- Lying BP- Sitting Pulse- Sitting BP- Standing at 0 minutes Pulse- Standing at 0 minutes  06/14/16 1009 139/78 65 139/81 69 137/84 76    Physical Exam  Constitutional: She is oriented to person, place, and time. She appears well-developed and well-nourished. No distress.  Pt lying on exam bed, does not appear to feel well but is cooperative during exam.  HENT:  Head: Normocephalic and atraumatic.  Right Ear: Tympanic membrane normal.  Left Ear: Tympanic membrane normal.  Nose: Nose normal.  Mouth/Throat: Uvula is midline, oropharynx is clear and moist and mucous membranes are normal.  Eyes: Conjunctivae and EOM are normal. Pupils are equal, round, and reactive to light. No scleral icterus.  Neck: Normal range of motion. Neck supple.  Cardiovascular: Normal rate, regular rhythm and normal heart sounds.   Pulmonary/Chest: Effort normal and breath sounds normal. No respiratory distress. She has no wheezes. She has no rales.  Abdominal: Soft. She exhibits no distension. There is no tenderness.  Musculoskeletal: Normal range of motion.  Neurological: She is alert and oriented to person, place, and time.  CN II-XII in tact. Speech is clear. Alert to person, place, and time. Normal finger to nose coordination. Normal gait.   Skin: Skin is warm and dry. Capillary refill takes less than 2 seconds. No rash noted. She is not diaphoretic. No pallor.  Nursing note and vitals reviewed.   Urgent Care Course   Clinical Course     Procedures (including critical care time)  Labs Review Labs Reviewed  POCT FASTING CBG [facility] MANUAL ENTRY - Abnormal; Notable for the following:       Result Value   POCT Glucose ([facility]) 116 (*)    All other components within normal limits  BASIC METABOLIC PANEL  POCT CBC W AUTO DIFF ([facility])    Imaging Review No results found.  Date/Time:06/14/16    10:00:30 Ventricular Rate: 70 PR Interval: 128 QRS Duration: 72 QT Interval: 420 QTC  Calculation: 453 P-R-T axis: 52   51    39 Text Interpretation: Normal sinus rhythm. Normal ECG    MDM   1. Near syncope   2. Generalized weakness   3. Loss of appetite    Pt c/o vague symptoms of "not feeling well" and generalized weakness.  Pt initially c/o nausea but declined nausea medication in UC stating she does not feel like she has to vomit but feels like her stomach is grumbling. Pt states she does not feel hungry though.  Normal orthostatic vitals and resting vitals. EKG- normal CBG- 116 (pt notes she did drink a Pepsi this morning)- WNL CBC- unremarkable, no evidence of anemia  BMP- pending  Reassured pt of normal workup in UC setting. She may try Zofran or Meclizine to see if either helps with her symptoms. Encouraged pt rest today and try a bland  diet. F/u with PCP next week for additional workup of recurrent episodes of generalized weakness. Discussed symptoms that warrant emergent care in the ED. Pt accompanied by her husband who is driving pt and also agreeable with tx plan.    Noland Fordyce, PA-C 06/14/16 [phone]

## 2016-06-15 LAB — BASIC METABOLIC PANEL
BUN: 19 mg/dL (ref 7–25)
CO2: 25 mmol/L (ref 20–31)
Calcium: 9.1 mg/dL (ref 8.6–10.2)
Chloride: 104 mmol/L (ref 98–110)
Creat: 0.94 mg/dL (ref 0.50–1.10)
Glucose, Bld: 99 mg/dL (ref 65–99)
Potassium: 4.4 mmol/L (ref 3.5–5.3)
Sodium: 137 mmol/L (ref 135–146)

## 2016-06-18 ENCOUNTER — Telehealth: Payer: Self-pay | Admitting: *Deleted

## 2016-06-18 NOTE — Telephone Encounter (Signed)
LM with BMP results and to call back if she has any questions or concerns.

## 2016-07-01 DIAGNOSIS — Z1231 Encounter for screening mammogram for malignant neoplasm of breast: Secondary | ICD-10-CM | POA: Diagnosis not present

## 2016-07-01 LAB — HM MAMMOGRAPHY

## 2016-07-02 ENCOUNTER — Ambulatory Visit (INDEPENDENT_AMBULATORY_CARE_PROVIDER_SITE_OTHER): Payer: 59 | Admitting: Physician Assistant

## 2016-07-02 ENCOUNTER — Encounter: Payer: Self-pay | Admitting: Physician Assistant

## 2016-07-02 VITALS — BP 152/72 | HR 90 | Ht 64.0 in | Wt 188.0 lb

## 2016-07-02 DIAGNOSIS — R0789 Other chest pain: Secondary | ICD-10-CM

## 2016-07-02 DIAGNOSIS — Z131 Encounter for screening for diabetes mellitus: Secondary | ICD-10-CM

## 2016-07-02 DIAGNOSIS — Z13220 Encounter for screening for lipoid disorders: Secondary | ICD-10-CM

## 2016-07-02 DIAGNOSIS — R55 Syncope and collapse: Secondary | ICD-10-CM | POA: Diagnosis not present

## 2016-07-02 DIAGNOSIS — Z0000 Encounter for general adult medical examination without abnormal findings: Secondary | ICD-10-CM | POA: Diagnosis not present

## 2016-07-02 NOTE — Progress Notes (Addendum)
Subjective:    Patient ID: Tanya [name], female    DOB: [birth year], 45 y.o.   MRN: [id number]  HPI Patient is a 45 yo female coming to the office today for a physical exam. Patient was also concerned about three episodes that she has experienced this year. She reports that her most recent episode happened November 10 of this year. The patient reports that this episode consisted of dizziness, nausea, vomiting and near syncope. Patient denies experiencing any pain. Patient went to urgent care and was worked-up for near syncope. An EKG was performed, along with BMP and fasting blood glucose with all results being normal. Patient had a similar episode where she lost consciousness in February of this year and broke her ankle due to falling after losing conciousness. She went to the ED and a CT of the head was performed which revealed no acute intracranial abnormality.   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 She also reports that she experiences palpations occasionally throughout the week, along with dull left sided chest pain that comes and does. She also reports becoming short of breath when she bends over to vacuum.   Review of Systems  Constitutional: Negative for activity change, appetite change and fatigue.  Respiratory: Positive for shortness of breath. Negative for apnea and cough.   Cardiovascular: Positive for chest pain and palpitations. Negative for leg swelling.  Neurological: Positive for dizziness, syncope and weakness. Negative for headaches.       Objective:   Physical Exam  Constitutional: She is oriented to person, place, and time. She appears well-developed and well-nourished. No distress.  HENT:  Head: Normocephalic and atraumatic.  Right Ear: External ear normal.  Left Ear: External ear normal.  Nose: Nose normal.  Mouth/Throat: Oropharynx is clear and moist.  Dix-Hallpike  negative   Eyes: EOM are normal. Pupils are equal, round, and reactive to light.  Neck: No tracheal deviation present. No thyromegaly present.  Cardiovascular: Normal rate, regular rhythm, normal heart sounds and intact distal pulses.  Exam reveals no gallop and no friction rub.   No murmur heard. Pulmonary/Chest: Effort normal and breath sounds normal. No respiratory distress. She has no wheezes. She has no rales. She exhibits no tenderness.  Abdominal: Soft. Bowel sounds are normal. She exhibits no distension and no mass. There is no tenderness. There is no rebound and no guarding.  Musculoskeletal: Normal range of motion. She exhibits tenderness (Tenderness to left ankle where patient had ankle surgery at the beginning of 2017.).  Lymphadenopathy:    She has no cervical adenopathy.  Neurological: She is alert and oriented to person, place, and time.  Cranial Nerves II-XII intact. +3 left patellar reflex. +2 right patellar reflex and +2 biceps reflex bilaterally. Strength 5/5 upper and lower extremities.   Skin: Skin is warm and dry.  Psychiatric: She has a normal mood and affect. Her behavior is normal. Judgment and thought content normal.      Assessment & Plan:  Diagnoses and all orders for this visit:  1. Routine physical examination -Lipid panel to screen for lipid disorders -COMPLETE METABOLIC PANEL WITH GFR to monitor electrolytes, liver and renal function and to screen for diabetes. -CBC with Differential/Platelet to screen for anemia.  -discussed vitamin D 800 units and calcium 1500mg   -encouraged 150 minutes of exercise a week -mammogram scheduled. -next pap 2018  2. Syncope and collapse -TSH due  to patient's symptoms of palpitations, left arm weakness and dizziness.  -Ferritin to check for iron deficiency anemia due to patient experiencing palpations.  -Ambulatory referral to Neurology due to patient's symptoms of dizziness, left arm weakness and syncope.  -Exercise  Tolerance Test due to patient experiencing syncope/near syncopal episodes along with intermittent chest pain and palpations.   3. Atypical chest pain       -Ordered Exercise Tolerance Test due to patient experiencing syncope/near syncopal             episodes along with intermittent chest pain and palpations.

## 2016-07-02 NOTE — Patient Instructions (Signed)
Will refer to neurology.  Will send for stress test.   Keeping You Healthy  Get These Tests 1. Blood Pressure- Have your blood pressure checked once a year by your health care provider.  Normal blood pressure is 120/80. 2. Weight- Have your body mass index (BMI) calculated to screen for obesity.  BMI is measure of body fat based on height and weight.  You can also calculate your own BMI at GravelBags.it. 3. Cholesterol- Have your cholesterol checked every 5 years starting at age 70 then yearly starting at age 48. 19. Chlamydia, HIV, and other sexually transmitted diseases- Get screened every year until age 15, then within three months of each new sexual provider. 5. Pap Test - Every 1-5 years; discuss with your health care provider. 6. Mammogram- Every 1-2 years starting at age 20--50  Take these medicines  Calcium with Vitamin D-Your body needs 1200 mg of Calcium each day and [phone] IU of Vitamin D daily.  Your body can only absorb 500 mg of Calcium at a time so Calcium must be taken in 2 or 3 divided doses throughout the day.  Multivitamin with folic acid- Once daily if it is possible for you to become pregnant.  Get these Immunizations  Gardasil-Series of three doses; prevents HPV related illness such as genital warts and cervical cancer.  Menactra-Single dose; prevents meningitis.  Tetanus shot- Every 10 years.  Flu shot-Every year.  Take these steps 1. Do not smoke-Your healthcare provider can help you quit.  For tips on how to quit go to www.smokefree.gov or call 1-800 QUITNOW. 2. Be physically active- Exercise 5 days a week for at least 30 minutes.  If you are not already physically active, start slow and gradually work up to 30 minutes of moderate physical activity.  Examples of moderate activity include walking briskly, dancing, swimming, bicycling, etc. 3. Breast Cancer- A self breast exam every month is important for early detection of breast cancer.  For more  information and instruction on self breast exams, ask your healthcare provider or https://www.patel.info/. 4. Eat a healthy diet- Eat a variety of healthy foods such as fruits, vegetables, whole grains, low fat milk, low fat cheeses, yogurt, lean meats, poultry and fish, beans, nuts, tofu, etc.  For more information go to www. Thenutritionsource.org 5. Drink alcohol in moderation- Limit alcohol intake to one drink or less per day. Never drink and drive. 6. Depression- Your emotional health is as important as your physical health.  If you're feeling down or losing interest in things you normally enjoy please talk to your healthcare provider about being screened for depression. 7. Dental visit- Brush and floss your teeth twice daily; visit your dentist twice a year. 8. Eye doctor- Get an eye exam at least every 2 years. 9. Helmet use- Always wear a helmet when riding a bicycle, motorcycle, rollerblading or skateboarding. 46. Safe sex- If you may be exposed to sexually transmitted infections, use a condom. 11. Seat belts- Seat belts can save your live; always wear one. 12. Smoke/Carbon Monoxide detectors- These detectors need to be installed on the appropriate level of your home. Replace batteries at least once a year. 13. Skin cancer- When out in the sun please cover up and use sunscreen 15 SPF or higher. 14. Violence- If anyone is threatening or hurting you, please tell your healthcare provider.

## 2016-07-03 LAB — COMPLETE METABOLIC PANEL WITH GFR
ALBUMIN: 3.8 g/dL (ref 3.6–5.1)
ALK PHOS: 60 U/L (ref 33–115)
ALT: 5 U/L — ABNORMAL LOW (ref 6–29)
AST: 13 U/L (ref 10–35)
BILIRUBIN TOTAL: 0.3 mg/dL (ref 0.2–1.2)
BUN: 22 mg/dL (ref 7–25)
CALCIUM: 9 mg/dL (ref 8.6–10.2)
CO2: 28 mmol/L (ref 20–31)
Chloride: 105 mmol/L (ref 98–110)
Creat: 0.86 mg/dL (ref 0.50–1.10)
GFR, EST NON AFRICAN AMERICAN: 81 mL/min (ref >=60)
GFR, Est African American: >89 mL/min (ref >=60)
Glucose, Bld: 97 mg/dL (ref 65–99)
POTASSIUM: 4.6 mmol/L (ref 3.5–5.3)
Sodium: 140 mmol/L (ref 135–146)
Total Protein: 6 g/dL — ABNORMAL LOW (ref 6.1–8.1)

## 2016-07-03 LAB — TSH: TSH: 2.07 m[IU]/L

## 2016-07-03 LAB — CBC WITH DIFFERENTIAL/PLATELET
BASOS PCT: 1 %
Basophils Absolute: 50 cells/uL (ref 0–200)
EOS PCT: 4 %
Eosinophils Absolute: 200 cells/uL (ref 15–500)
HCT: 32.6 % — ABNORMAL LOW (ref 35.0–45.0)
HEMOGLOBIN: 10 g/dL — AB (ref 11.7–15.5)
LYMPHS ABS: 1450 {cells}/uL (ref 850–3900)
Lymphocytes Relative: 29 %
MCH: 21.5 pg — ABNORMAL LOW (ref 27.0–33.0)
MCHC: 30.7 g/dL — AB (ref 32.0–36.0)
MCV: 70 fL — ABNORMAL LOW (ref 80.0–100.0)
MONOS PCT: 5 %
Monocytes Absolute: 250 cells/uL (ref 200–950)
NEUTROS ABS: 3050 {cells}/uL (ref 1500–7800)
NEUTROS PCT: 61 %
PLATELETS: 211 10*3/uL (ref 140–400)
RBC: 4.66 MIL/uL (ref 3.80–5.10)
RDW: 16.5 % — ABNORMAL HIGH (ref 11.0–15.0)
WBC: 5 10*3/uL (ref 3.8–10.8)

## 2016-07-03 LAB — FERRITIN: FERRITIN: 3 ng/mL — AB (ref 10–232)

## 2016-07-03 LAB — LIPID PANEL
CHOL/HDL RATIO: 4.6 ratio (ref <5.0)
CHOLESTEROL: 174 mg/dL (ref <200)
HDL: 38 mg/dL — AB (ref >50)
LDL Cholesterol: 109 mg/dL — ABNORMAL HIGH (ref <100)
TRIGLYCERIDES: 136 mg/dL (ref <150)
VLDL: 27 mg/dL (ref <30)

## 2016-07-04 ENCOUNTER — Encounter: Payer: Self-pay | Admitting: Physician Assistant

## 2016-07-04 DIAGNOSIS — D509 Iron deficiency anemia, unspecified: Secondary | ICD-10-CM | POA: Insufficient documentation

## 2016-07-04 MED ORDER — FERROUS SULFATE 325 (65 FE) MG PO TABS: 325.0000 mg | ORAL_TABLET | Freq: Every day | ORAL | 3 refills | Status: DC

## 2016-07-04 NOTE — Addendum Note (Signed)
Addended byDonella Stade on: 07/04/2016 11:01 AM   Modules accepted: Orders

## 2016-07-10 ENCOUNTER — Encounter: Payer: Self-pay | Admitting: Physician Assistant

## 2016-07-24 ENCOUNTER — Ambulatory Visit (INDEPENDENT_AMBULATORY_CARE_PROVIDER_SITE_OTHER): Payer: 59

## 2016-07-24 DIAGNOSIS — R55 Syncope and collapse: Secondary | ICD-10-CM | POA: Diagnosis not present

## 2016-07-24 DIAGNOSIS — R0789 Other chest pain: Secondary | ICD-10-CM | POA: Diagnosis not present

## 2016-07-24 LAB — EXERCISE TOLERANCE TEST
CHL CUP RESTING HR STRESS: 75 {beats}/min
CHL CUP STRESS STAGE 2 GRADE: 0 %
CHL CUP STRESS STAGE 2 SPEED: 1 mph
CHL CUP STRESS STAGE 3 GRADE: 0 %
CHL CUP STRESS STAGE 3 HR: 82 {beats}/min
CHL CUP STRESS STAGE 3 SPEED: 1 mph
CHL CUP STRESS STAGE 4 DBP: 65 mmHg
CHL CUP STRESS STAGE 4 GRADE: 10 %
CHL CUP STRESS STAGE 5 SBP: 205 mmHg
CHL CUP STRESS STAGE 6 SPEED: 3.4 mph
CHL CUP STRESS STAGE 7 DBP: 70 mmHg
CHL CUP STRESS STAGE 7 GRADE: 0 %
CHL CUP STRESS STAGE 8 DBP: 73 mmHg
CHL CUP STRESS STAGE 8 GRADE: 0 %
CHL CUP STRESS STAGE 8 HR: 87 {beats}/min
CHL RATE OF PERCEIVED EXERTION: 17
CSEPED: 7 min
CSEPEDS: 24 s
CSEPEW: 9.1 METS
CSEPHR: 89 %
MPHR: 173 {beats}/min
Peak HR: 155 {beats}/min
Percent of predicted max HR: 89 %
Stage 1 DBP: 71 mmHg
Stage 1 Grade: 0 %
Stage 1 HR: 83 {beats}/min
Stage 1 SBP: 151 mmHg
Stage 1 Speed: 0 mph
Stage 2 HR: 83 {beats}/min
Stage 4 HR: 118 {beats}/min
Stage 4 SBP: 184 mmHg
Stage 4 Speed: 1.7 mph
Stage 5 DBP: 73 mmHg
Stage 5 Grade: 12 %
Stage 5 HR: 136 {beats}/min
Stage 5 Speed: 2.5 mph
Stage 6 Grade: 14 %
Stage 6 HR: 155 {beats}/min
Stage 7 HR: 117 {beats}/min
Stage 7 SBP: 203 mmHg
Stage 7 Speed: 0 mph
Stage 8 SBP: 160 mmHg
Stage 8 Speed: 0 mph

## 2016-07-25 ENCOUNTER — Encounter: Payer: Self-pay | Admitting: Physician Assistant

## 2016-07-26 DIAGNOSIS — K219 Gastro-esophageal reflux disease without esophagitis: Secondary | ICD-10-CM | POA: Diagnosis not present

## 2016-07-26 DIAGNOSIS — R42 Dizziness and giddiness: Secondary | ICD-10-CM | POA: Diagnosis not present

## 2016-07-26 DIAGNOSIS — Z79899 Other long term (current) drug therapy: Secondary | ICD-10-CM | POA: Diagnosis not present

## 2016-07-26 DIAGNOSIS — R110 Nausea: Secondary | ICD-10-CM | POA: Diagnosis not present

## 2016-07-31 ENCOUNTER — Telehealth: Payer: Self-pay | Admitting: *Deleted

## 2016-07-31 NOTE — Telephone Encounter (Signed)
Pt left vm stating that she had another dizzy spell and went to the ED because of it.  Pt is wondering if she should wear a Holter monitor.  Please advise.

## 2016-08-01 NOTE — Telephone Encounter (Signed)
We can certain order but once again your "spells" have been months apart. What about a neurology referral?

## 2016-08-14 ENCOUNTER — Ambulatory Visit: Payer: 59 | Admitting: Neurology

## 2016-09-04 ENCOUNTER — Telehealth: Payer: Self-pay | Admitting: *Deleted

## 2016-09-04 DIAGNOSIS — D509 Iron deficiency anemia, unspecified: Secondary | ICD-10-CM

## 2016-09-04 NOTE — Telephone Encounter (Signed)
Labs ordered.

## 2016-09-05 LAB — CBC WITH DIFFERENTIAL/PLATELET
BASOS ABS: 72 {cells}/uL (ref 0–200)
Basophils Relative: 1 %
EOS PCT: 4 %
Eosinophils Absolute: 288 cells/uL (ref 15–500)
HCT: 39.5 % (ref 35.0–45.0)
Hemoglobin: 12.7 g/dL (ref 11.7–15.5)
Lymphocytes Relative: 28 %
Lymphs Abs: 2016 cells/uL (ref 850–3900)
MCH: 24.8 pg — AB (ref 27.0–33.0)
MCHC: 32.2 g/dL (ref 32.0–36.0)
MCV: 77.1 fL — ABNORMAL LOW (ref 80.0–100.0)
MONOS PCT: 5 %
Monocytes Absolute: 360 cells/uL (ref 200–950)
NEUTROS ABS: 4464 {cells}/uL (ref 1500–7800)
NEUTROS PCT: 62 %
PLATELETS: 200 10*3/uL (ref 140–400)
RBC: 5.12 MIL/uL — ABNORMAL HIGH (ref 3.80–5.10)
RDW: 20.2 % — AB (ref 11.0–15.0)
WBC: 7.2 10*3/uL (ref 3.8–10.8)

## 2016-09-05 LAB — FERRITIN: FERRITIN: 11 ng/mL (ref 10–232)

## 2016-09-09 ENCOUNTER — Encounter: Payer: Self-pay | Admitting: Neurology

## 2016-09-09 ENCOUNTER — Ambulatory Visit (INDEPENDENT_AMBULATORY_CARE_PROVIDER_SITE_OTHER): Payer: 59 | Admitting: Neurology

## 2016-09-09 DIAGNOSIS — R55 Syncope and collapse: Secondary | ICD-10-CM

## 2016-09-09 HISTORY — DX: Syncope and collapse: R55

## 2016-09-09 MED ORDER — ALPRAZOLAM 0.5 MG PO TABS: ORAL_TABLET | ORAL | 0 refills | Status: DC

## 2016-09-09 NOTE — Patient Instructions (Signed)
   We will check MRI of the brain and a carotid dopplr study.

## 2016-09-09 NOTE — Progress Notes (Signed)
Reason for visit: Syncope  Referring physician: Dr. Anibal Henderson is a 46 y.o. female  History of present illness:  Ms. Trautwein is a 46 year old right-handed white female with a history of gastroesophageal reflux disease. The patient has reported onset of episodes of syncope and near-syncope that began in December 2016. The patient had an event in February 2017 associated with loss of consciousness and a fracture of the right ankle. The patient indicates that she will feel lightheaded and floaty, no true vertigo has been noted. The patient will feel nauseated and she may have vomiting. If she does not get her head down, she may black out. She will feel poorly throughout the day, and somewhat fatigued and wiped out for several days afterwards. The patient does not have a history of migraine headaches and she does not report headaches with the above episodes. The patient has had events in December 2016, February 2017, November 2017, and December 2017. The patient was given meclizine, Ativan, and Zofran on the last visit to the emergency room in December 2017. The patient gains benefit with these medications. She has gone off of Prilosec over the last 3 months, she is now on a probiotic for her gastroesophageal reflux disease. The patient has with one event noted some left neck and ear discomfort, with some discomfort down the left arm. The patient has undergone an exercise stress test that was unremarkable. She denies any ongoing problems with the left arm or with chest pain. The patient denies any stomach cramps or diarrhea. She denies issues controlling the bowels or the bladder or any problems with numbness or weakness of the face, arms, or legs. She denies shortness of breath or palpitations of the heart. She is sent to this office for an evaluation. The patient does not get diaphoretic with the episodes. The patient does not report any hearing changes, ringing in the ears, or ear pain, but  the left ear feels "funny" at times. The patient has not had any slurred speech, double vision, difficulty with swallowing.  Past Medical History:  Diagnosis Date  . GERD (gastroesophageal reflux disease)   . Syncope and collapse 09/09/2016    Past Surgical History:  Procedure Laterality Date  . FINGER SURGERY    . TENDON REPAIR     RT 4th digit  . tri mallear      Family History  Problem Relation Age of Onset  . Cancer Father     prostate  . Heart attack Maternal Grandmother   . Hypertension Maternal Grandmother   . Parkinson's disease Maternal Grandfather     Social history:  reports that she has never smoked. She has never used smokeless tobacco. She reports that she drinks alcohol. She reports that she does not use drugs.  Medications:  Prior to Admission medications   Medication Sig Start Date End Date Taking? Authorizing Provider  Calcium Carbonate (CALCIUM 600 PO) Take 1 tablet by mouth daily.    Yes Historical Provider, MD  calcium carbonate (TUMS EX) 750 MG chewable tablet Chew 1 tablet by mouth as needed. Takes 3-4 tablets   Yes Historical Provider, MD  Cholecalciferol (VITAMIN D) 2000 units CAPS Take 2,000 Units by mouth at bedtime.    Yes Historical Provider, MD  ferrous sulfate 325 (65 FE) MG tablet Take 1 tablet (325 mg total) by mouth daily with breakfast. Patient taking differently: Take 325 mg by mouth 2 (two) times daily with a meal.  07/04/16  Yes  Jade L Breeback, PA-C  Naproxen Sodium (ALEVE PO) Take 2 tablets by mouth as needed.    Yes Historical Provider, MD  ALPRAZolam Duanne Moron) 0.5 MG tablet Take 2 tablets approximately 45 minutes prior to the MRI study, take a third tablet if needed. 09/09/16   Kathrynn Ducking, MD      Allergies  Allergen Reactions  . Other     Cats per pt    ROS:  Out of a complete 14 system review of symptoms, the patient complains only of the following symptoms, and all other reviewed systems are negative.  Dizziness  Blood  pressure (!) 150/80, pulse 64, height 5\' 4"  (1.626 m), weight 190 lb (86.2 kg).   Blood pressure, right arm, standing is 136/82. Blood pressure, right arm, sitting is 128/80.  Physical Exam  General: The patient is alert and cooperative at the time of the examination. The patient is moderately obese.  Eyes: Pupils are equal, round, and reactive to light. Discs are flat bilaterally.  Ears: Tympanic membranes are clear bilaterally.  Neck: The neck is supple, no carotid bruits are noted.  Respiratory: The respiratory examination is clear.  Cardiovascular: The cardiovascular examination reveals a regular rate and rhythm, no obvious murmurs or rubs are noted.  Skin: Extremities are without significant edema.  Neurologic Exam  Mental status: The patient is alert and oriented x 3 at the time of the examination. The patient has apparent normal recent and remote memory, with an apparently normal attention span and concentration ability.  Cranial nerves: Facial symmetry is present. There is good sensation of the face to pinprick and soft touch bilaterally. The strength of the facial muscles and the muscles to head turning and shoulder shrug are normal bilaterally. Speech is well enunciated, no aphasia or dysarthria is noted. Extraocular movements are full. Visual fields are full. The tongue is midline, and the patient has symmetric elevation of the soft palate. No obvious hearing deficits are noted.  Motor: The motor testing reveals 5 over 5 strength of all 4 extremities. Good symmetric motor tone is noted throughout.  Sensory: Sensory testing is intact to pinprick, soft touch, vibration sensation, and position sense on all 4 extremities. No evidence of extinction is noted.  Coordination: Cerebellar testing reveals good finger-nose-finger and heel-to-shin bilaterally.  Gait and station: Gait is normal. Tandem gait is normal. Romberg is negative. No drift is seen.  Reflexes: Deep tendon  reflexes are symmetric and normal bilaterally. Toes are downgoing bilaterally.   Assessment/Plan:  1. Episodic dizziness, near syncope and syncope  The episodes of dizziness are generally associated with nausea. Certainly, a vasovagal syncopal event needs to be considered, but the duration of symptoms is unusual for this diagnosis. The patient may feel poorly for 24 hours and feel wiped out for several days after an event. She does not report any history of migraine headaches. Given the history of left arm discomfort and some chest pain, cardiac ischemia does need to be considered but an exercise stress test was unremarkable. The patient will be set up for MRI of the brain, and for a carotid Doppler study. The patient will follow-up in 4 months.  Jill Alexanders MD 09/09/2016 10:13 AM  [facility] [address] [place], [place] [address]  Phone [phone] Fax [phone]

## 2016-09-11 NOTE — Telephone Encounter (Signed)
Does not need office visit but can come get labs.

## 2016-09-22 DIAGNOSIS — M7731 Calcaneal spur, right foot: Secondary | ICD-10-CM | POA: Diagnosis not present

## 2016-09-22 DIAGNOSIS — Z967 Presence of other bone and tendon implants: Secondary | ICD-10-CM | POA: Diagnosis not present

## 2016-09-22 DIAGNOSIS — Z8781 Personal history of (healed) traumatic fracture: Secondary | ICD-10-CM | POA: Diagnosis not present

## 2016-09-26 MED FILL — ALPRAZolam 0.5 MG TABS: 0.5 | 1 days supply | Qty: 3 | Fill #0

## 2016-10-01 ENCOUNTER — Ambulatory Visit (INDEPENDENT_AMBULATORY_CARE_PROVIDER_SITE_OTHER): Payer: 59

## 2016-10-01 DIAGNOSIS — R55 Syncope and collapse: Secondary | ICD-10-CM

## 2016-10-01 MED ORDER — GADOPENTETATE DIMEGLUMINE 469.01 MG/ML IV SOLN: 17.0000 mL | Freq: Once | INTRAVENOUS | Status: AC | PRN

## 2016-10-02 ENCOUNTER — Telehealth: Payer: Self-pay | Admitting: Neurology

## 2016-10-02 NOTE — Telephone Encounter (Signed)
  I called patient. MRI the brain was unremarkable, a carotid Doppler study is pending.  MRI brain 10/02/16:  IMPRESSION:  This is a normal MRI of the brain with and without contrast. Incidental note is made of the left cerebellar tonsil extending slightly below the foramen magnum but not enough to be considered a Chiari malformation.

## 2016-10-08 ENCOUNTER — Ambulatory Visit (INDEPENDENT_AMBULATORY_CARE_PROVIDER_SITE_OTHER): Payer: 59

## 2016-10-08 DIAGNOSIS — R55 Syncope and collapse: Secondary | ICD-10-CM

## 2016-10-17 ENCOUNTER — Encounter: Payer: Self-pay | Admitting: Physician Assistant

## 2016-10-17 ENCOUNTER — Ambulatory Visit (INDEPENDENT_AMBULATORY_CARE_PROVIDER_SITE_OTHER): Payer: 59 | Admitting: Physician Assistant

## 2016-10-17 VITALS — BP 130/79 | HR 86 | Ht 64.0 in | Wt 192.0 lb

## 2016-10-17 DIAGNOSIS — M65351 Trigger finger, right little finger: Secondary | ICD-10-CM

## 2016-10-17 DIAGNOSIS — R55 Syncope and collapse: Secondary | ICD-10-CM | POA: Diagnosis not present

## 2016-10-17 DIAGNOSIS — F419 Anxiety disorder, unspecified: Secondary | ICD-10-CM | POA: Diagnosis not present

## 2016-10-17 MED ORDER — MECLIZINE HCL 32 MG PO TABS: 32.0000 mg | ORAL_TABLET | Freq: Three times a day (TID) | ORAL | 2 refills | Status: DC | PRN

## 2016-10-17 MED ORDER — CITALOPRAM HYDROBROMIDE 10 MG PO TABS: 10.0000 mg | ORAL_TABLET | Freq: Every day | ORAL | 1 refills | Status: DC

## 2016-10-17 MED ORDER — LORAZEPAM 0.5 MG PO TABS: 0.5000 mg | ORAL_TABLET | Freq: Three times a day (TID) | ORAL | 5 refills | Status: DC | PRN

## 2016-10-17 MED ORDER — ONDANSETRON 4 MG PO TBDP: 4.0000 mg | ORAL_TABLET | Freq: Three times a day (TID) | ORAL | 2 refills | Status: DC | PRN

## 2016-10-17 MED FILL — CITALOPRAM HBR 10 MG TABLET: 10 | 30 days supply | Qty: 30 | Fill #0

## 2016-10-17 MED FILL — ONDANSETRON ODT 4 MG TABLET: 4 | 10 days supply | Qty: 30 | Fill #0

## 2016-10-17 MED FILL — LORazepam 0.5 MG TABS: 0.5 | 10 days supply | Qty: 30 | Fill #0

## 2016-10-19 DIAGNOSIS — M65342 Trigger finger, left ring finger: Secondary | ICD-10-CM | POA: Insufficient documentation

## 2016-10-19 DIAGNOSIS — F419 Anxiety disorder, unspecified: Secondary | ICD-10-CM | POA: Insufficient documentation

## 2016-10-19 NOTE — Progress Notes (Signed)
   Subjective:    Patient ID: Tanya [name], female    DOB: 15-Nov-1970, 46 y.o.   MRN: [id number]  HPI Pt is a 46 yo female who presents to the clinic for medication refills. She has been on a path to find out why she is getting dizzy and almost fainting for almost a year. She has a neurologist and had been to ED multiple times. The most recent visit they prescribed antivert, ativan, zofran. Within 15 minutes in ED symptoms resolved. Now at home she is using different variations to control symptoms and seems to be working. Episodes happen at different times of day and night. She can go months without anything and then have a few in a row. She does not feel depressed or anxious. She has good and bad nights with sleep and getting to sleep. She had stopped her omeprazole but heart burn has returned. She is going to start pepcid and see if helps with GERD.   She denies any SOB/CP/Chest tightness.   She does have some off and on pain of right pinky. Seems to get "caught" a lot and hard to push back down.    Review of Systems    see HPI.  Objective:   Physical Exam  Constitutional: She is oriented to person, place, and time. She appears well-developed and well-nourished.  HENT:  Head: Normocephalic and atraumatic.  Cardiovascular: Normal rate, regular rhythm and normal heart sounds.   Pulmonary/Chest: Effort normal and breath sounds normal.  Neurological: She is alert and oriented to person, place, and time.  Psychiatric: She has a normal mood and affect. Her behavior is normal.          Assessment & Plan:  Marland KitchenMarland KitchenDiagnoses and all orders for this visit:  Syncope and collapse -     ondansetron (ZOFRAN-ODT) 4 MG disintegrating tablet; Take 1 tablet (4 mg total) by mouth every 8 (eight) hours as needed for nausea or vomiting. -     meclizine (ANTIVERT) 32 MG tablet; Take 1 tablet (32 mg total) by mouth 3 (three) times daily as needed. -     LORazepam (ATIVAN) 0.5 MG tablet; Take 1 tablet (0.5 mg  total) by mouth every 8 (eight) hours as needed for anxiety.  Trigger little finger of right hand  Anxiety -     citalopram (CELEXA) 10 MG tablet; Take 1 tablet (10 mg total) by mouth daily. -     LORazepam (ATIVAN) 0.5 MG tablet; Take 1 tablet (0.5 mg total) by mouth every 8 (eight) hours as needed for anxiety.  .. Depression screen [facility] 2/9 10/17/2016  Decreased Interest 0  Down, Depressed, Hopeless 0  PHQ - 2 Score 0    Refilled medications requested. Working with neurologist. Could be anxiety. Started celexa. Discussed side effects. Follow up in 4-6 weeks. Ativan as needed. Discussed abuse potential.  antivert and zofran as needed.  Will follow up in 1-2 months.   Trigger finger early stages. HO given. Consider splinting. NSAIDs as needed or sports medicine for injection.

## 2016-10-22 ENCOUNTER — Telehealth: Payer: Self-pay | Admitting: Neurology

## 2016-10-22 NOTE — Telephone Encounter (Signed)
I called the patient. The carotid Doppler studies appeared be normal. I have recommended a cardiac workup given her episodes of near-syncope and chest pain.  The neurologic workup has been unremarkable.

## 2016-11-06 DIAGNOSIS — H527 Unspecified disorder of refraction: Secondary | ICD-10-CM | POA: Diagnosis not present

## 2016-11-06 DIAGNOSIS — H43811 Vitreous degeneration, right eye: Secondary | ICD-10-CM | POA: Diagnosis not present

## 2016-11-28 ENCOUNTER — Encounter: Payer: Self-pay | Admitting: Physician Assistant

## 2016-11-28 ENCOUNTER — Ambulatory Visit (INDEPENDENT_AMBULATORY_CARE_PROVIDER_SITE_OTHER): Payer: 59 | Admitting: Physician Assistant

## 2016-11-28 ENCOUNTER — Ambulatory Visit: Payer: 59 | Admitting: Physician Assistant

## 2016-11-28 VITALS — BP 113/73 | HR 73 | Ht 64.0 in | Wt 190.0 lb

## 2016-11-28 DIAGNOSIS — R42 Dizziness and giddiness: Secondary | ICD-10-CM | POA: Diagnosis not present

## 2016-11-28 DIAGNOSIS — Z111 Encounter for screening for respiratory tuberculosis: Secondary | ICD-10-CM

## 2016-11-28 DIAGNOSIS — H9312 Tinnitus, left ear: Secondary | ICD-10-CM | POA: Diagnosis not present

## 2016-11-28 DIAGNOSIS — R790 Abnormal level of blood mineral: Secondary | ICD-10-CM

## 2016-11-28 DIAGNOSIS — R110 Nausea: Secondary | ICD-10-CM | POA: Diagnosis not present

## 2016-11-28 DIAGNOSIS — M542 Cervicalgia: Secondary | ICD-10-CM | POA: Diagnosis not present

## 2016-11-28 DIAGNOSIS — Z23 Encounter for immunization: Secondary | ICD-10-CM

## 2016-11-28 MED ORDER — PREDNISONE 50 MG PO TABS: ORAL_TABLET | ORAL | 0 refills | Status: DC

## 2016-11-28 MED FILL — CITALOPRAM HBR 10 MG TABLET: 10 | 30 days supply | Qty: 30 | Fill #1

## 2016-11-28 NOTE — Progress Notes (Signed)
   Subjective:    Patient ID: Tanya [name], female    DOB: 01-13-1971, 46 y.o.   MRN: [id number]  HPI  Pt is a 46 yo female who presents to the clinic for follow up on celexa. We thought some of her dizziness episodes could be linked to anxiety. She feels no different on celexa. She has not had a syncope episode. She has had numb sensation around left neck and ear. If she pushes just below mastoid bone of left ear relieves pressure. Denies any jaw pain, problems opening or shutting mouth, eating.  previous episodes have consisted of nausea, dizziness and tinnitus. She is concerned about meineres. She has not noticed any hearing loss. The only thing that has ever helped is ativan/antivert/zofran.   She request b12 checked.   She also wound like a form filled out for work/classes she would like to take.     Review of Systems  All other systems reviewed and are negative.      Objective:   Physical Exam  Constitutional: She is oriented to person, place, and time. She appears well-developed and well-nourished.  HENT:  Head: Normocephalic and atraumatic.  NO tenderness to palpation over left ear.  TM's clear bilaterally.  No tenderness over mastoid or TMJ.  Good ROM of jaw.   Eyes: Conjunctivae are normal. Right eye exhibits no discharge. Left eye exhibits no discharge.  Neck: Normal range of motion. Neck supple. No thyromegaly present.  Cardiovascular: Normal rate, regular rhythm and normal heart sounds.   Pulmonary/Chest: Effort normal and breath sounds normal.  Lymphadenopathy:    She has no cervical adenopathy.  Neurological: She is alert and oriented to person, place, and time.  Psychiatric: She has a normal mood and affect. Her behavior is normal.          Assessment & Plan:  Marland KitchenMarland KitchenDiagnoses and all orders for this visit:  Neck pain on left side -     predniSONE (DELTASONE) 50 MG tablet; Take one tablet for 5 days. -     Ambulatory referral to ENT  Screening-pulmonary TB -      Quantiferon tb gold assay (blood)  Low iron stores -     B12 -     CBC with Differential/Platelet -     Ferritin  Tinnitus of left ear -     predniSONE (DELTASONE) 50 MG tablet; Take one tablet for 5 days. -     Ambulatory referral to ENT  Need for Tdap vaccination -     Tdap vaccine greater than or equal to 7yo IM  Dizziness -     predniSONE (DELTASONE) 50 MG tablet; Take one tablet for 5 days. -     Ambulatory referral to ENT  Nausea -     Ambulatory referral to ENT   Referral made to rule out menieres due to symptoms.  Prednisone given for 5 days.  Paperwork for work filled out.  Stop celexa due to no benefit.

## 2016-11-28 NOTE — Patient Instructions (Signed)
Will make referral for ENT.

## 2016-11-29 LAB — CBC WITH DIFFERENTIAL/PLATELET
BASOS ABS: 0 {cells}/uL (ref 0–200)
Basophils Relative: 0 %
EOS ABS: 204 {cells}/uL (ref 15–500)
Eosinophils Relative: 3 %
HEMATOCRIT: 40.7 % (ref 35.0–45.0)
HEMOGLOBIN: 13.7 g/dL (ref 11.7–15.5)
LYMPHS ABS: 1836 {cells}/uL (ref 850–3900)
LYMPHS PCT: 27 %
MCH: 28.4 pg (ref 27.0–33.0)
MCHC: 33.7 g/dL (ref 32.0–36.0)
MCV: 84.4 fL (ref 80.0–100.0)
MONO ABS: 476 {cells}/uL (ref 200–950)
MPV: 11.4 fL (ref 7.5–12.5)
Monocytes Relative: 7 %
NEUTROS PCT: 63 %
Neutro Abs: 4284 cells/uL (ref 1500–7800)
Platelets: 241 10*3/uL (ref 140–400)
RBC: 4.82 MIL/uL (ref 3.80–5.10)
RDW: 14.6 % (ref 11.0–15.0)
WBC: 6.8 10*3/uL (ref 3.8–10.8)

## 2016-11-29 LAB — FERRITIN: Ferritin: 21 ng/mL (ref 10–232)

## 2016-11-29 LAB — VITAMIN B12: Vitamin B-12: 897 pg/mL (ref 200–1100)

## 2016-12-01 LAB — QUANTIFERON TB GOLD ASSAY (BLOOD)
INTERFERON GAMMA RELEASE ASSAY: NEGATIVE
Mitogen-Nil: 9.57 IU/mL
Quantiferon Nil Value: 0.05 IU/mL
Quantiferon Tb Ag Minus Nil Value: 0.04 IU/mL

## 2016-12-01 NOTE — Progress Notes (Signed)
TB negative. We can fill out paperwork and get patient a copy and scan a copy.

## 2016-12-02 ENCOUNTER — Encounter: Payer: Self-pay | Admitting: Physician Assistant

## 2017-01-12 DIAGNOSIS — R42 Dizziness and giddiness: Secondary | ICD-10-CM | POA: Diagnosis not present

## 2017-01-12 DIAGNOSIS — Z9181 History of falling: Secondary | ICD-10-CM | POA: Diagnosis not present

## 2017-01-12 DIAGNOSIS — H93293 Other abnormal auditory perceptions, bilateral: Secondary | ICD-10-CM | POA: Diagnosis not present

## 2017-01-12 DIAGNOSIS — D649 Anemia, unspecified: Secondary | ICD-10-CM | POA: Diagnosis not present

## 2017-01-21 ENCOUNTER — Ambulatory Visit: Payer: 59 | Admitting: Neurology

## 2017-01-21 DIAGNOSIS — M47892 Other spondylosis, cervical region: Secondary | ICD-10-CM | POA: Diagnosis not present

## 2017-01-21 DIAGNOSIS — H9202 Otalgia, left ear: Secondary | ICD-10-CM | POA: Diagnosis not present

## 2017-01-21 DIAGNOSIS — J342 Deviated nasal septum: Secondary | ICD-10-CM | POA: Diagnosis not present

## 2017-01-22 ENCOUNTER — Telehealth: Payer: Self-pay | Admitting: Family Medicine

## 2017-01-22 NOTE — Telephone Encounter (Signed)
Received phone call from physician at [facility]. Unfortunately our phones and fax were down to a. It did speak to her over the cell phone in regards to patient's condition. In addition to ear ringing she had complained of neck discomfort. CT was obtained which did show a 2 x 1.8 cm mass b around T4-T5. It was unclear if congenital are not. They recommend MRI for further evaluation. Physician requested that we contact the patient and get MRI place per patient preference. We'll contact patient to see if she would like to come in to discuss the results were just arrange MRI for further evaluation. He is a: Employees so we'll try to keep her in network for imaging.

## 2017-01-23 DIAGNOSIS — H81392 Other peripheral vertigo, left ear: Secondary | ICD-10-CM | POA: Diagnosis not present

## 2017-01-26 NOTE — Telephone Encounter (Signed)
Please call patient with information in regards to above. See what she would like to do please and let me know.

## 2017-01-27 NOTE — Telephone Encounter (Signed)
lvm asking that pt rtn call .Tanya [name]

## 2017-01-29 ENCOUNTER — Ambulatory Visit (INDEPENDENT_AMBULATORY_CARE_PROVIDER_SITE_OTHER): Payer: 59 | Admitting: Family Medicine

## 2017-01-29 ENCOUNTER — Encounter: Payer: Self-pay | Admitting: Family Medicine

## 2017-01-29 VITALS — BP 123/64 | HR 80 | Resp 16 | Wt 188.8 lb

## 2017-01-29 DIAGNOSIS — M899 Disorder of bone, unspecified: Secondary | ICD-10-CM | POA: Diagnosis not present

## 2017-01-29 DIAGNOSIS — M898X8 Other specified disorders of bone, other site: Secondary | ICD-10-CM

## 2017-01-29 NOTE — Progress Notes (Signed)
   Subjective:    Patient ID: Tanya [name], female    DOB: June 01, 1971, 46 y.o.   MRN: [id number]  HPI 46 year old female comes in today to review recent CT results. She actually presented about 8 weeks ago And planing of a numb sensation around the left ear and neck is been going on for almost a year. She says if she pushes on the mastoid bone it relieves pressure. She was having nausea dizziness and ear ringing at the time and was referred to ENT. They did decide to do a CT of the neck with contrast. It did show a low-density structure in the paraspinal region on the left at T4-5 measuring approximately 2 x 1.8 cm. There were no aggressive features appreciated. They thought it could be a congenital cyst versus a nerve sheath tumor and recommend MRI of spine with and without contrast. She wanted to take medication before her procedure.   Review of Systems     Objective:   Physical Exam  Constitutional: She is oriented to person, place, and time. She appears well-developed and well-nourished.  HENT:  Head: Normocephalic and atraumatic.  Eyes: Conjunctivae and EOM are normal.  Cardiovascular: Normal rate.   Pulmonary/Chest: Effort normal.  Neurological: She is alert and oriented to person, place, and time.  Skin: Skin is dry. No pallor.  Psychiatric: She has a normal mood and affect. Her behavior is normal.  Vitals reviewed.       Assessment & Plan:  Possible cyst on thoracid spine T4-5 on the left side - Reviewed results with her today. She has a very good understanding of what's going on. schedule for MRI for further evaluation.   Time spent 20 minutes, greater than 50% of time spent counseling about cyst on the thoracic spine.

## 2017-01-29 NOTE — Patient Instructions (Signed)
We will call with the MRI results once available.

## 2017-02-14 ENCOUNTER — Ambulatory Visit (HOSPITAL_BASED_OUTPATIENT_CLINIC_OR_DEPARTMENT_OTHER): Payer: 59

## 2017-02-21 ENCOUNTER — Ambulatory Visit (HOSPITAL_BASED_OUTPATIENT_CLINIC_OR_DEPARTMENT_OTHER)
Admission: RE | Admit: 2017-02-21 | Discharge: 2017-02-21 | Disposition: A | Discharge status: Home / Self Care | Payer: 59 | Source: Ambulatory Visit | Attending: Family Medicine | Admitting: Family Medicine

## 2017-02-21 DIAGNOSIS — M8568 Other cyst of bone, other site: Secondary | ICD-10-CM | POA: Diagnosis not present

## 2017-02-21 DIAGNOSIS — M899 Disorder of bone, unspecified: Secondary | ICD-10-CM | POA: Diagnosis present

## 2017-02-21 DIAGNOSIS — M898X8 Other specified disorders of bone, other site: Secondary | ICD-10-CM

## 2017-02-21 DIAGNOSIS — M7138 Other bursal cyst, other site: Secondary | ICD-10-CM | POA: Diagnosis not present

## 2017-02-21 MED ORDER — GADOBENATE DIMEGLUMINE 529 MG/ML IV SOLN
15.0000 mL | Freq: Once | INTRAVENOUS | Status: AC | PRN
  Administered 2017-02-21: 15 mL via INTRAVENOUS

## 2017-04-23 ENCOUNTER — Ambulatory Visit (INDEPENDENT_AMBULATORY_CARE_PROVIDER_SITE_OTHER): Payer: 59 | Admitting: Family Medicine

## 2017-04-23 ENCOUNTER — Encounter: Payer: Self-pay | Admitting: Family Medicine

## 2017-04-23 VITALS — BP 149/86 | HR 80 | Wt 190.0 lb

## 2017-04-23 DIAGNOSIS — M79605 Pain in left leg: Secondary | ICD-10-CM

## 2017-04-23 DIAGNOSIS — G5712 Meralgia paresthetica, left lower limb: Secondary | ICD-10-CM

## 2017-04-23 MED ORDER — GABAPENTIN 300 MG PO CAPS: 300.0000 mg | ORAL_CAPSULE | Freq: Every day | ORAL | 3 refills | Status: DC

## 2017-04-23 NOTE — Progress Notes (Signed)
   Subjective:    I'm seeing this patient as a consultation for: Iran Planas, PA  CC: Left thigh pain  HPI: Patient reports left thigh pain for about 3 weeks. She reports that she has had similar symptoms around 1 year ago which resolved spontaneously. Patient describes the pain as intermittent and occurring more often at night. The pain usually lasts for about 1 minute and decreases with rubbing the affected area. Patient notes burning, numbness, and soreness after these episodes. Patient denies any increase in pain with walking, radiation of pain, or trauma. Patient denies any personal or family history of blood clots.   Past medical history, Surgical history, Family history not pertinant except as noted below, Social history, Allergies, and medications have been entered into the medical record, reviewed, and no changes needed.   Review of Systems: No headache, visual changes, nausea, vomiting, diarrhea, constipation, dizziness, abdominal pain, skin rash, fevers, chills, night sweats, weight loss, swollen lymph nodes, body aches, joint swelling, muscle aches, chest pain, shortness of breath, mood changes, visual or auditory hallucinations.   Objective:    Vitals:   04/23/17 1311  BP: (!) 149/86  Pulse: 80   General: Well Developed, well nourished, and in no acute distress.  Neuro/Psych: Alert and oriented x3, extra-ocular muscles intact, able to move all 4 extremities, sensation grossly intact. Skin: Warm and dry, no rashes noted.  Respiratory: Not using accessory muscles, speaking in full sentences, trachea midline.  Cardiovascular: Pulses palpable, no extremity edema. Abdomen: Does not appear distended. MSK: Left thigh/hip: Spider veins present on upper thigh, no bruising or edema No tenderness to palpation Range of motion with flexion and extension is normal, hip abduction normal ROM Strength is 5/5 with flexion and extension  No results found for this or any previous visit  (from the past 24 hour(s)). No results found.  Impression and Recommendations:    Assessment and Plan: 46 y.o. female with left thigh pain. Given duration of symptoms, characterization of pain, and physical exam findings, this is most likely meralgia paresthetica. Other possible etiologies include deep vein thrombosis, although this is less likely. Patient will undergo d-dimer, CBC and metabolic panel to further evaluate. The importance of weight loss and avoiding tight or constrictive clothing around the midsection were discussed. Patient will start gabapentin nightly. She should plan to follow-up in 1 month or sooner if symptoms do not improve.     Orders Placed This Encounter  Procedures  . CBC  . COMPLETE METABOLIC PANEL WITH GFR  . D-Dimer, Quantitative   Meds ordered this encounter  Medications  . gabapentin (NEURONTIN) 300 MG capsule    Sig: Take 1 capsule (300 mg total) by mouth at bedtime.    Dispense:  90 capsule    Refill:  3    Discussed warning signs or symptoms. Please see discharge instructions. Patient expresses understanding.

## 2017-04-23 NOTE — Patient Instructions (Signed)
Thank you for coming in today. Get labs today.  Try gabapentin at bedtime.  Recheck with me in 1 month or sooner if needed.   I think the condition Is meralgia paresthetica.  Lateral Femoral Cutaneous Nerve Block Patient Information  Description: The lateral femoral cutaneous nerve of the thigh is a purely sensory nerve that can become entrapped or irritated for a number of reasons.  The pain associated with this condition is called meralgia paraesthetica.  Patients affected with this syndrome have burning pain or abnormal sensation along the lateral aspect of the thigh.  The pain can be worsened by prolonged walking, standing, or constrictive garments around the house.   The diagnosis can be confirmed and treatment initiated by blocking the nerve with local anesthetic (like Novocaine).  At times, a steroid solution may be injected at the same time.  The site of injection is through a tiny needle in the left, lower quadrant of the abdomen.   The entire block usually lasts less than 5 minutes.  Conditions which may be treated by lateral femoral cutaneous nerve block:   Meralagia paraesthetica  Preparation for the injection:  1. Do not eat any solid food or dairy products within 8 hours of your appointment.  2. You may drink clear liquids up to 3 hours before appointment.  Clear liquids include water, black coffee, juice or soda. No milk or cream please. 3. You may take your regular medication, including pain medications, with a sip of water before your appointment.  Diabetics should hold regular insulin (if taken separately) and take 1/2 normal NPH dose the morning of the procedure.  Carry some sugar containing items with you to your appointment. 4. A driver must accompany you and be prepared to drive you home after your procedure. 5. Bring all you current medications with you 6. An IV may be inserted and sedation may be given at the discretion of the physician. 7. A blood pressure cuff, EKG  and other monitors will often be applied during the procedure.  Some patients may need to have extra oxygen administered for a short period. 8. You will be asked to provide medical information, including your allergies and medications, prior to the procedure.  We must know immediately if you are taking blood thinners (like Coumadin/Warfarin) or if you allergic to IV iodine contrast (dye)  We must know if you could possible be pregnant.   Possible side-effects:   Bleeding from needle site  Infection (rate, may require surgery)  Nerve injury (rare)  Numbness and Tingling (temporary)  Light-headedness (temporary)  Pain at injection site (several day)  Decreased blood pressure (rare, temporary)  Weakness in leg (temporary)  Call if you experience:  Hives or difficulty breathing (go to the emergency room)  Inflammation or drainage at the injection site(s)  Please note:  Although the local anesthetic injected can often make your leg feel good for several hours after the injection,  The pain may return.  It takes 3-7 days for steroids to work.  You may not notice any pain relief for at least one week.  If effective, we will often do a series of injections spaced 3-6 weeks apart to maximally decrease your pain.  If you have any questions, please cll [phone] [facility]

## 2017-04-24 LAB — COMPLETE METABOLIC PANEL WITH GFR
AG RATIO: 1.8 (calc) (ref 1.0–2.5)
ALBUMIN MSPROF: 4.2 g/dL (ref 3.6–5.1)
ALKALINE PHOSPHATASE (APISO): 67 U/L (ref 33–115)
ALT: 6 U/L (ref 6–29)
AST: 14 U/L (ref 10–35)
BUN: 20 mg/dL (ref 7–25)
CO2: 23 mmol/L (ref 20–32)
CREATININE: 0.95 mg/dL (ref 0.50–1.10)
Calcium: 8.9 mg/dL (ref 8.6–10.2)
Chloride: 106 mmol/L (ref 98–110)
GFR, Est African American: 82 mL/min/{1.73_m2} (ref >=60)
GFR, Est Non African American: 71 mL/min/{1.73_m2} (ref >=60)
GLOBULIN: 2.3 g/dL (ref 1.9–3.7)
Glucose, Bld: 95 mg/dL (ref 65–99)
POTASSIUM: 4.3 mmol/L (ref 3.5–5.3)
SODIUM: 139 mmol/L (ref 135–146)
Total Bilirubin: 0.4 mg/dL (ref 0.2–1.2)
Total Protein: 6.5 g/dL (ref 6.1–8.1)

## 2017-04-24 LAB — CBC
HEMATOCRIT: 39.7 % (ref 35.0–45.0)
HEMOGLOBIN: 13 g/dL (ref 11.7–15.5)
MCH: 27.5 pg (ref 27.0–33.0)
MCHC: 32.7 g/dL (ref 32.0–36.0)
MCV: 83.9 fL (ref 80.0–100.0)
MPV: 12.4 fL (ref 7.5–12.5)
PLATELETS: 256 10*3/uL (ref 140–400)
RBC: 4.73 10*6/uL (ref 3.80–5.10)
RDW: 12.8 % (ref 11.0–15.0)
WBC: 6.3 10*3/uL (ref 3.8–10.8)

## 2017-04-24 LAB — D-DIMER, QUANTITATIVE (NOT AT ARMC): D DIMER QUANT: 0.25 ug{FEU}/mL (ref <0.50)

## 2017-05-21 ENCOUNTER — Ambulatory Visit: Payer: 59 | Admitting: Family Medicine

## 2017-06-04 ENCOUNTER — Ambulatory Visit: Payer: 59 | Admitting: Family Medicine

## 2017-06-05 ENCOUNTER — Ambulatory Visit (INDEPENDENT_AMBULATORY_CARE_PROVIDER_SITE_OTHER): Payer: 59 | Admitting: Family Medicine

## 2017-06-05 ENCOUNTER — Encounter: Payer: Self-pay | Admitting: Family Medicine

## 2017-06-05 DIAGNOSIS — G5712 Meralgia paresthetica, left lower limb: Secondary | ICD-10-CM | POA: Diagnosis not present

## 2017-06-05 MED ORDER — NORTRIPTYLINE HCL 10 MG PO CAPS: 10.0000 mg | ORAL_CAPSULE | Freq: Every day | ORAL | 1 refills | Status: DC

## 2017-06-05 NOTE — Patient Instructions (Signed)
Thank you for coming in today. Try neothylline at bedtime at 10mg .  You can increase to dose to 20mg  as needed.  Let me know how you are doing.  Next steps could include MRI or Nerve Conduction study or injection.

## 2017-06-05 NOTE — Progress Notes (Signed)
Tanya [name] is a 46 y.o. female who presents to [facility] today for follow-up left leg paresthesia.  Was seen in late September for paresthesias of the left leg thought to be metalgia paresthetica.  Unfortunately she was not tolerant of the gabapentin that we try to use for symptom control. However fortunately her symptoms have reduced some in the interim. Her main bothersome symptom at the last visit was nighttime symptoms that were waking her from sleep. This has since significantly improved. She would like to avoid further workup if possible.   Past Medical History:  Diagnosis Date  . GERD (gastroesophageal reflux disease)   . Syncope and collapse 09/09/2016   Past Surgical History:  Procedure Laterality Date  . FINGER SURGERY    . TENDON REPAIR     RT 4th digit  . tri mallear     Social History   Tobacco Use  . Smoking status: Never Smoker  . Smokeless tobacco: Never Used  Substance Use Topics  . Alcohol use: Yes     ROS:  As above   Medications: Current Outpatient Medications  Medication Sig Dispense Refill  . calcium carbonate (TUMS EX) 750 MG chewable tablet Chew 1 tablet by mouth as needed. Takes 3-4 tablets    . fluticasone (FLONASE) 50 MCG/ACT nasal spray Place 1 spray into both nostrils daily.    Marland Kitchen LORazepam (ATIVAN) 0.5 MG tablet Take 1 tablet (0.5 mg total) by mouth every 8 (eight) hours as needed for anxiety. 30 tablet 5  . meclizine (ANTIVERT) 32 MG tablet Take 1 tablet (32 mg total) by mouth 3 (three) times daily as needed. 30 tablet 2  . Naproxen Sodium (ALEVE PO) Take 2 tablets by mouth as needed.     Marland Kitchen omeprazole (PRILOSEC OTC) 20 MG tablet Take 20 mg daily by mouth.    . ondansetron (ZOFRAN-ODT) 4 MG disintegrating tablet Take 1 tablet (4 mg total) by mouth every 8 (eight) hours as needed for nausea or vomiting. 30 tablet 2  . gabapentin (NEURONTIN) 300 MG capsule Take 1 capsule (300 mg total) by mouth at  bedtime. (Patient not taking: Reported on 06/05/2017) 90 capsule 3  . nortriptyline (PAMELOR) 10 MG capsule Take 1 capsule (10 mg total) at bedtime by mouth. One capsule by mouth each bedtime for a week then 2 capsules at bedtime. 30 capsule 1   No current facility-administered medications for this visit.    Facility-Administered Medications Ordered in Other Visits  Medication Dose Route Frequency Provider Last Rate Last Dose  . gadopentetate dimeglumine (MAGNEVIST) injection 17 mL  17 mL Intravenous Once PRN Kathrynn Ducking, MD       Allergies  Allergen Reactions  . Other     Cats per pt     Exam:  BP 124/67   Pulse 76   Temp 98.3 F (36.8 C) (Oral)   Wt 196 lb (88.9 kg)   SpO2 98%   BMI 33.64 kg/m  General: Well Developed, well nourished, and in no acute distress.  Neuro/Psych: Alert and oriented x3, extra-ocular muscles intact, able to move all 4 extremities, sensation grossly intact. Skin: Warm and dry, no rashes noted.  Respiratory: Not using accessory muscles, speaking in full sentences, trachea midline.  Cardiovascular: Pulses palpable, no extremity edema. Abdomen: Does not appear distended. MSK: Left leg normal appearing normal gait    No results found for this or any previous visit (from the past 48 hour(s)). No results found.  Assessment and Plan: 46 y.o. female with meralgia paresthetica of the left leg.  Plan to try nortriptyline and recheck as needed if not well-controlled and symptoms are bothersome enough we'll probably proceed with ultrasound-guided hydrodissection of the lateral femoral cutaneous nerve versus further workup including either MRI or nerve conduction study.      No orders of the defined types were placed in this encounter.  Meds ordered this encounter  Medications  . omeprazole (PRILOSEC OTC) 20 MG tablet    Sig: Take 20 mg daily by mouth.  . nortriptyline (PAMELOR) 10 MG capsule    Sig: Take 1 capsule (10 mg total) at bedtime by  mouth. One capsule by mouth each bedtime for a week then 2 capsules at bedtime.    Dispense:  30 capsule    Refill:  1    Discussed warning signs or symptoms. Please see discharge instructions. Patient expresses understanding.

## 2017-09-02 ENCOUNTER — Other Ambulatory Visit (HOSPITAL_COMMUNITY)
Admission: RE | Admit: 2017-09-02 | Discharge: 2017-09-02 | Disposition: A | Discharge status: Home / Self Care | Payer: 59 | Source: Ambulatory Visit | Attending: Physician Assistant | Admitting: Physician Assistant

## 2017-09-02 ENCOUNTER — Encounter: Payer: Self-pay | Admitting: Physician Assistant

## 2017-09-02 ENCOUNTER — Ambulatory Visit (INDEPENDENT_AMBULATORY_CARE_PROVIDER_SITE_OTHER): Payer: 59 | Admitting: Physician Assistant

## 2017-09-02 VITALS — BP 146/64 | HR 72 | Ht 64.0 in | Wt 195.0 lb

## 2017-09-02 DIAGNOSIS — K2100 Gastro-esophageal reflux disease with esophagitis, without bleeding: Secondary | ICD-10-CM

## 2017-09-02 DIAGNOSIS — Z131 Encounter for screening for diabetes mellitus: Secondary | ICD-10-CM | POA: Diagnosis not present

## 2017-09-02 DIAGNOSIS — Z13220 Encounter for screening for lipoid disorders: Secondary | ICD-10-CM

## 2017-09-02 DIAGNOSIS — Z6833 Body mass index (BMI) 33.0-33.9, adult: Secondary | ICD-10-CM | POA: Diagnosis not present

## 2017-09-02 DIAGNOSIS — G44039 Episodic paroxysmal hemicrania, not intractable: Secondary | ICD-10-CM | POA: Diagnosis not present

## 2017-09-02 DIAGNOSIS — E6609 Other obesity due to excess calories: Secondary | ICD-10-CM | POA: Diagnosis not present

## 2017-09-02 DIAGNOSIS — Z0000 Encounter for general adult medical examination without abnormal findings: Secondary | ICD-10-CM | POA: Diagnosis not present

## 2017-09-02 DIAGNOSIS — Z01419 Encounter for gynecological examination (general) (routine) without abnormal findings: Secondary | ICD-10-CM | POA: Insufficient documentation

## 2017-09-02 DIAGNOSIS — R110 Nausea: Secondary | ICD-10-CM | POA: Diagnosis not present

## 2017-09-02 NOTE — Patient Instructions (Signed)
Keeping You Healthy  Get These Tests Blood Pressure- Have your blood pressure checked once a year by your health care provider.  Normal blood pressure is 120/80. Weight- Have your body mass index (BMI) calculated to screen for obesity.  BMI is measure of body fat based on height and weight.  You can also calculate your own BMI at GravelBags.it. Cholesterol- Have your cholesterol checked every 5 years starting at age 49 then yearly starting at age 46. Chlamydia, HIV, and other sexually transmitted diseases- Get screened every year until age 24, then within three months of each new sexual provider. Pap Test - Every 1-5 years; discuss with your health care provider. Mammogram- Every 1-2 years starting at age 35--50  Take these medicines Calcium with Vitamin D-Your body needs 1200 mg of Calcium each day and [phone] IU of Vitamin D daily.  Your body can only absorb 500 mg of Calcium at a time so Calcium must be taken in 2 or 3 divided doses throughout the day. Multivitamin with folic acid- Once daily if it is possible for you to become pregnant.  Get these Immunizations Gardasil-Series of three doses; prevents HPV related illness such as genital warts and cervical cancer. Menactra-Single dose; prevents meningitis. Tetanus shot- Every 10 years. Flu shot-Every year.  Take these steps Do not smoke-Your healthcare provider can help you quit.  For tips on how to quit go to www.smokefree.gov or call 1-800 QUITNOW. Be physically active- Exercise 5 days a week for at least 30 minutes.  If you are not already physically active, start slow and gradually work up to 30 minutes of moderate physical activity.  Examples of moderate activity include walking briskly, dancing, swimming, bicycling, etc. Breast Cancer- A self breast exam every month is important for early detection of breast cancer.  For more information and instruction on self breast exams, ask your healthcare provider or  https://www.patel.info/. Eat a healthy diet- Eat a variety of healthy foods such as fruits, vegetables, whole grains, low fat milk, low fat cheeses, yogurt, lean meats, poultry and fish, beans, nuts, tofu, etc.  For more information go to www. Thenutritionsource.org Drink alcohol in moderation- Limit alcohol intake to one drink or less per day. Never drink and drive. Depression- Your emotional health is as important as your physical health.  If you're feeling down or losing interest in things you normally enjoy please talk to your healthcare provider about being screened for depression. Dental visit- Brush and floss your teeth twice daily; visit your dentist twice a year. Eye doctor- Get an eye exam at least every 2 years. Helmet use- Always wear a helmet when riding a bicycle, motorcycle, rollerblading or skateboarding. Safe sex- If you may be exposed to sexually transmitted infections, use a condom. Seat belts- Seat belts can save your live; always wear one. Smoke/Carbon Monoxide detectors- These detectors need to be installed on the appropriate level of your home. Replace batteries at least once a year. Skin cancer- When out in the sun please cover up and use sunscreen 15 SPF or higher. Violence- If anyone is threatening or hurting you, please tell your healthcare provider.

## 2017-09-06 DIAGNOSIS — Z6833 Body mass index (BMI) 33.0-33.9, adult: Secondary | ICD-10-CM

## 2017-09-06 DIAGNOSIS — E66812 Obesity, class 2: Secondary | ICD-10-CM | POA: Insufficient documentation

## 2017-09-06 DIAGNOSIS — E6609 Other obesity due to excess calories: Secondary | ICD-10-CM | POA: Insufficient documentation

## 2017-09-06 DIAGNOSIS — G44039 Episodic paroxysmal hemicrania, not intractable: Secondary | ICD-10-CM | POA: Insufficient documentation

## 2017-09-06 NOTE — Progress Notes (Signed)
Subjective:     Tanya [name] is a 47 y.o. female and is here for a comprehensive physical exam. The patient reports no problems.  Social History   Socioeconomic History  . Marital status: Married    Spouse name: Legrand Como  . Number of children: 0  . Years of education: BA  . Highest education level: Not on file  Social Needs  . Financial resource strain: Not on file  . Food insecurity - worry: Not on file  . Food insecurity - inability: Not on file  . Transportation needs - medical: Not on file  . Transportation needs - non-medical: Not on file  Occupational History  . Occupation: [employer]  Tobacco Use  . Smoking status: Never Smoker  . Smokeless tobacco: Never Used  Substance and Sexual Activity  . Alcohol use: Yes  . Drug use: No  . Sexual activity: Yes  Other Topics Concern  . Not on file  Social History Narrative   Lives with husband   Caffeine use:     Health Maintenance  Topic Date Due  . PAP SMEAR  09/01/2014  . MAMMOGRAM  07/01/2017  . HIV Screening  07/03/2026 (Originally [birth year]/1983)  . TETANUS/TDAP  11/29/2026  . INFLUENZA VACCINE  Completed    The following portions of the patient's history were reviewed and updated as appropriate: allergies, current medications, past family history, past medical history, past social history, past surgical history and problem list.  Review of Systems A comprehensive review of systems was negative.   Objective:    BP (!) 146/64   Pulse 72   Ht 5\' 4"  (1.626 m)   Wt 195 lb (88.5 kg)   BMI 33.47 kg/m  General appearance: alert, cooperative, appears stated age and moderately obese Head: Normocephalic, without obvious abnormality, atraumatic Eyes: conjunctivae/corneas clear. PERRL, EOM's intact. Fundi benign. Ears: normal TM's and external ear canals both ears Nose: Nares normal. Septum midline. Mucosa normal. No drainage or sinus tenderness. Throat: lips, mucosa, and tongue normal;  teeth and gums normal Neck: no adenopathy, no carotid bruit, no JVD, supple, symmetrical, trachea midline and thyroid not enlarged, symmetric, no tenderness/mass/nodules Back: symmetric, no curvature. ROM normal. No CVA tenderness. Lungs: clear to auscultation bilaterally Breasts: normal appearance, no masses or tenderness Heart: regular rate and rhythm, S1, S2 normal, no murmur, click, rub or gallop Abdomen: soft, non-tender; bowel sounds normal; no masses,  no organomegaly Pelvic: cervix normal in appearance, external genitalia normal, no adnexal masses or tenderness, no cervical motion tenderness, uterus normal size, shape, and consistency and vagina normal without discharge Extremities: extremities normal, atraumatic, no cyanosis or edema Pulses: 2+ and symmetric Skin: Skin color, texture, turgor normal. No rashes or lesions Lymph nodes: Cervical, supraclavicular, and axillary nodes normal. Neurologic: Alert and oriented X 3, normal strength and tone. Normal symmetric reflexes. Normal coordination and gait    Assessment:    Healthy female exam.      Plan:      Marland KitchenMarland KitchenBrisa was seen today for annual exam and gynecologic exam.  Diagnoses and all orders for this visit:  Routine physical examination -     Lipid Panel w/reflex Direct LDL -     COMPLETE METABOLIC PANEL WITH GFR -     TSH -     CBC with Differential/Platelet  Encounter for well woman exam with routine gynecological exam -     Cytology - PAP  Gastroesophageal reflux disease with esophagitis  Episodic paroxysmal hemicrania,  not intractable  Nausea  Screening for diabetes mellitus -     COMPLETE METABOLIC PANEL WITH GFR  Screening for lipid disorders -     Lipid Panel w/reflex Direct LDL  Class 1 obesity due to excess calories without serious comorbidity with body mass index (BMI) of 33.0 to 33.9 in adult  .Marland Kitchen Depression screen [facility] 2/9 09/02/2017 06/05/2017 11/28/2016 10/17/2016  Decreased Interest 0 0 0 0  Down,  Depressed, Hopeless 0 0 0 0  PHQ - 2 Score 0 0 0 0  Altered sleeping - - 0 -  Tired, decreased energy - - 0 -  Change in appetite - - 0 -  Feeling bad or failure about yourself  - - 0 -  Trouble concentrating - - 0 -  Suicidal thoughts - - 0 -  PHQ-9 Score - - 0 -   .Marland Kitchen Discussed 150 minutes of exercise a week.  Encouraged vitamin D 1000 units and Calcium 1300mg  or 4 servings of dairy a day.    Mammogram ordered.  Pap done today. Declined STD testing.   Discussed weight loss. Marland Kitchen.Discussed low carb diet with 1500 calories and 80g of protein.  Exercising at least 150 minutes a week.  [employer] could be a Microbiologist.    See After Visit Summary for Counseling Recommendations

## 2017-09-07 LAB — CYTOLOGY - PAP
ADEQUACY: ABSENT
DIAGNOSIS: NEGATIVE
HPV (WINDOPATH): NOT DETECTED

## 2017-09-21 DIAGNOSIS — Z131 Encounter for screening for diabetes mellitus: Secondary | ICD-10-CM | POA: Diagnosis not present

## 2017-09-21 DIAGNOSIS — Z0000 Encounter for general adult medical examination without abnormal findings: Secondary | ICD-10-CM | POA: Diagnosis not present

## 2017-09-21 DIAGNOSIS — Z13220 Encounter for screening for lipoid disorders: Secondary | ICD-10-CM | POA: Diagnosis not present

## 2017-09-22 ENCOUNTER — Encounter: Payer: Self-pay | Admitting: Physician Assistant

## 2017-09-22 DIAGNOSIS — E785 Hyperlipidemia, unspecified: Secondary | ICD-10-CM | POA: Insufficient documentation

## 2017-09-22 NOTE — Progress Notes (Signed)
Call pt: cholesterol is elevated. LDL high/hDL low but overall 10 year cardiac risk is still 2.2 percent and not indicated for statin intervention. Need to work on this with diet. Could consider red yeast rice 1200mg  bid. Low fat diet and exercise.  Thyroid is great.  Fasting glucose is elevated just a tad. Add a1c, please.

## 2017-09-25 LAB — CBC WITH DIFFERENTIAL/PLATELET
BASOS PCT: 0.9 %
Basophils Absolute: 50 cells/uL (ref 0–200)
EOS PCT: 4.8 %
Eosinophils Absolute: 269 cells/uL (ref 15–500)
HCT: 38.2 % (ref 35.0–45.0)
HEMOGLOBIN: 12.7 g/dL (ref 11.7–15.5)
Lymphs Abs: 1400 cells/uL (ref 850–3900)
MCH: 26.8 pg — AB (ref 27.0–33.0)
MCHC: 33.2 g/dL (ref 32.0–36.0)
MCV: 80.6 fL (ref 80.0–100.0)
MONOS PCT: 5.7 %
MPV: 12.1 fL (ref 7.5–12.5)
NEUTROS ABS: 3562 {cells}/uL (ref 1500–7800)
Neutrophils Relative %: 63.6 %
PLATELETS: 230 10*3/uL (ref 140–400)
RBC: 4.74 10*6/uL (ref 3.80–5.10)
RDW: 12.9 % (ref 11.0–15.0)
TOTAL LYMPHOCYTE: 25 %
WBC mixed population: 319 cells/uL (ref 200–950)
WBC: 5.6 10*3/uL (ref 3.8–10.8)

## 2017-09-25 LAB — COMPLETE METABOLIC PANEL WITH GFR
AG RATIO: 1.7 (calc) (ref 1.0–2.5)
ALT: 7 U/L (ref 6–29)
AST: 16 U/L (ref 10–35)
Albumin: 4.1 g/dL (ref 3.6–5.1)
Alkaline phosphatase (APISO): 75 U/L (ref 33–115)
BILIRUBIN TOTAL: 0.5 mg/dL (ref 0.2–1.2)
BUN: 20 mg/dL (ref 7–25)
CHLORIDE: 104 mmol/L (ref 98–110)
CO2: 27 mmol/L (ref 20–32)
Calcium: 9.3 mg/dL (ref 8.6–10.2)
Creat: 1 mg/dL (ref 0.50–1.10)
GFR, EST AFRICAN AMERICAN: 77 mL/min/{1.73_m2} (ref >=60)
GFR, Est Non African American: 67 mL/min/{1.73_m2} (ref >=60)
GLUCOSE: 106 mg/dL — AB (ref 65–99)
Globulin: 2.4 g/dL (calc) (ref 1.9–3.7)
POTASSIUM: 4.3 mmol/L (ref 3.5–5.3)
Sodium: 139 mmol/L (ref 135–146)
TOTAL PROTEIN: 6.5 g/dL (ref 6.1–8.1)

## 2017-09-25 LAB — TEST AUTHORIZATION

## 2017-09-25 LAB — LIPID PANEL W/REFLEX DIRECT LDL
CHOL/HDL RATIO: 5.2 (calc) — AB (ref <5.0)
Cholesterol: 219 mg/dL — ABNORMAL HIGH (ref <200)
HDL: 42 mg/dL — ABNORMAL LOW (ref >50)
LDL Cholesterol (Calc): 151 mg/dL (calc) — ABNORMAL HIGH
NON-HDL CHOLESTEROL (CALC): 177 mg/dL — AB (ref <130)
TRIGLYCERIDES: 133 mg/dL (ref <150)

## 2017-09-25 LAB — TSH: TSH: 2.23 m[IU]/L

## 2017-09-25 LAB — HEMOGLOBIN A1C W/OUT EAG: Hgb A1c MFr Bld: 5.6 % of total Hgb (ref <5.7)

## 2017-09-25 NOTE — Progress Notes (Signed)
Call pt: a1c is 5.6. Progressing towards pre-diabets. Start to make diet changes now so this doesn't go any farther.

## 2017-12-27 ENCOUNTER — Encounter: Payer: Self-pay | Admitting: Emergency Medicine

## 2017-12-27 ENCOUNTER — Emergency Department
Admission: EM | Admit: 2017-12-27 | Discharge: 2017-12-27 | Disposition: A | Discharge status: Home / Self Care | Payer: 59 | Source: Home / Self Care | Attending: Family Medicine | Admitting: Family Medicine

## 2017-12-27 DIAGNOSIS — R062 Wheezing: Secondary | ICD-10-CM

## 2017-12-27 DIAGNOSIS — J209 Acute bronchitis, unspecified: Secondary | ICD-10-CM | POA: Diagnosis not present

## 2017-12-27 MED ORDER — ALBUTEROL SULFATE HFA 108 (90 BASE) MCG/ACT IN AERS: 1.0000 | INHALATION_SPRAY | Freq: Four times a day (QID) | RESPIRATORY_TRACT | 0 refills | Status: DC | PRN

## 2017-12-27 MED ORDER — AZITHROMYCIN 250 MG PO TABS: 250.0000 mg | ORAL_TABLET | Freq: Every day | ORAL | 0 refills | Status: DC

## 2017-12-27 NOTE — ED Provider Notes (Signed)
[name]    CSN: [id number] Arrival date & time: 12/27/17  1619     History   Chief Complaint Chief Complaint  Patient presents with  . Cough    HPI Tanya [name] is a 47 y.o. female.   HPI Tanya [name] is a 47 y.o. female presenting to UC with c/o gradually worsening productive cough over the last 10 days.  She has tried OTC medications, only getting temporary relief with cough drops. Cough is worse at night.  She has also developed Right ear fullness.  Denies fever, chills, n/v/d. Her husband was sick for about 2 days but improved quickly after taking Zicam.    Past Medical History:  Diagnosis Date  . GERD (gastroesophageal reflux disease)   . Syncope and collapse 09/09/2016    Patient Active Problem List   Diagnosis Date Noted  . Dyslipidemia (high LDL; low HDL) 09/22/2017  . Episodic paroxysmal hemicrania, not intractable 09/06/2017  . Class 1 obesity due to excess calories without serious comorbidity with body mass index (BMI) of 33.0 to 33.9 in adult 09/06/2017  . Meralgia paraesthetica, left 06/05/2017  . Tinnitus of left ear 11/28/2016  . Neck pain on left side 11/28/2016  . Trigger little finger of right hand 10/19/2016  . Anxiety 10/19/2016  . Syncope and collapse 09/09/2016  . IDA (iron deficiency anemia) 07/04/2016  . Trigger thumb of left hand 04/17/2014  . Dermatitis 07/16/2012  . Seasonal allergies 07/16/2012  . GERD 07/19/2010    Past Surgical History:  Procedure Laterality Date  . FINGER SURGERY    . TENDON REPAIR     RT 4th digit  . tri mallear      OB History   None      Home Medications    Prior to Admission medications   Medication Sig Start Date End Date Taking? Authorizing Provider  albuterol (PROVENTIL HFA;VENTOLIN HFA) 108 (90 Base) MCG/ACT inhaler Inhale 1-2 puffs into the lungs every 6 (six) hours as needed for wheezing or shortness of breath. 12/27/17   Noe Gens, PA-C  azithromycin (ZITHROMAX) 250 MG tablet  Take 1 tablet (250 mg total) by mouth daily. Take first 2 tablets together, then 1 every day until finished. 12/27/17   Noe Gens, PA-C  calcium carbonate (TUMS EX) 750 MG chewable tablet Chew 1 tablet by mouth as needed. Takes 3-4 tablets    [provider]  fluticasone (FLONASE) 50 MCG/ACT nasal spray Place 1 spray into both nostrils daily.    [provider]  LORazepam (ATIVAN) 0.5 MG tablet Take 1 tablet (0.5 mg total) by mouth every 8 (eight) hours as needed for anxiety. 10/17/16   Breeback, Royetta Car, PA-C  meclizine (ANTIVERT) 32 MG tablet Take 1 tablet (32 mg total) by mouth 3 (three) times daily as needed. 10/17/16   Breeback, Luvenia Starch L, PA-C  Naproxen Sodium (ALEVE PO) Take 2 tablets by mouth as needed.     [provider]  omeprazole (PRILOSEC OTC) 20 MG tablet Take 20 mg daily by mouth.    [provider]  ondansetron (ZOFRAN-ODT) 4 MG disintegrating tablet Take 1 tablet (4 mg total) by mouth every 8 (eight) hours as needed for nausea or vomiting. 10/17/16   Donella Stade, PA-C    Family History Family History  Problem Relation Age of Onset  . Cancer Father        prostate  . Heart attack Maternal Grandmother   . Hypertension Maternal Grandmother   .  Parkinson's disease Maternal Grandfather     Social History Social History   Tobacco Use  . Smoking status: Never Smoker  . Smokeless tobacco: Never Used  Substance Use Topics  . Alcohol use: Yes  . Drug use: No     Allergies   Other   Review of Systems Review of Systems  Constitutional: Negative for chills and fever.  HENT: Positive for congestion, ear pain (Right) and sinus pressure. Negative for sore throat, trouble swallowing and voice change.   Respiratory: Positive for cough. Negative for shortness of breath.   Cardiovascular: Negative for chest pain and palpitations.  Gastrointestinal: Negative for abdominal pain, diarrhea, nausea and vomiting.  Musculoskeletal: Negative for  arthralgias, back pain and myalgias.  Skin: Negative for rash.     Physical Exam Triage Vital Signs ED Triage Vitals [12/27/17 1648]  Enc Vitals Group     BP (!) 143/82     Pulse Rate 86     Resp      Temp 99 F (37.2 C)     Temp Source Oral     SpO2 98 %     Weight 193 lb 4 oz (87.7 kg)     Height 5\' 4"  (1.626 m)     Head Circumference      Peak Flow      Pain Score 0     Pain Loc      Pain Edu?      Excl. in [place]?    No data found.  Updated Vital Signs BP (!) 143/82 (BP Location: Right Arm)   Pulse 86   Temp 99 F (37.2 C) (Oral)   Ht 5\' 4"  (1.626 m)   Wt 193 lb 4 oz (87.7 kg)   SpO2 98%   BMI 33.17 kg/m   Visual Acuity Right Eye Distance:   Left Eye Distance:   Bilateral Distance:    Right Eye Near:   Left Eye Near:    Bilateral Near:     Physical Exam  Constitutional: She is oriented to person, place, and time. She appears well-developed and well-nourished. No distress.  HENT:  Head: Normocephalic and atraumatic.  Right Ear: Tympanic membrane normal.  Left Ear: Tympanic membrane normal.  Nose: Mucosal edema present. Right sinus exhibits no maxillary sinus tenderness and no frontal sinus tenderness. Left sinus exhibits no maxillary sinus tenderness and no frontal sinus tenderness.  Mouth/Throat: Uvula is midline, oropharynx is clear and moist and mucous membranes are normal.  Eyes: EOM are normal.  Neck: Normal range of motion. Neck supple.  Cardiovascular: Normal rate and regular rhythm.  Pulmonary/Chest: Effort normal. No stridor. No respiratory distress. She has wheezes. She has rhonchi.  Diffuse wheeze and rhonchi without respiratory distress   Musculoskeletal: Normal range of motion.  Lymphadenopathy:    She has no cervical adenopathy.  Neurological: She is alert and oriented to person, place, and time.  Skin: Skin is warm and dry. She is not diaphoretic.  Psychiatric: She has a normal mood and affect. Her behavior is normal.  Nursing note and  vitals reviewed.    UC Treatments / Results  Labs (all labs ordered are listed, but only abnormal results are displayed) Labs Reviewed - No data to display  EKG None  Radiology No results found.  Procedures Procedures (including critical care time)  Medications Ordered in UC Medications - No data to display  Initial Impression / Assessment and Plan / UC Course  I have reviewed the triage vital signs and  the nursing notes.  Pertinent labs & imaging results that were available during my care of the patient were reviewed by me and considered in my medical decision making (see chart for details).     Will tx for bacterial bronchitis given persistence of worsening cough. Pt info packet provided.  Final Clinical Impressions(s) / UC Diagnoses   Final diagnoses:  Acute bronchitis, unspecified organism  Wheeze     Discharge Instructions      Please follow up with your family provider in 1 week if not improving, sooner if worsening.     ED Prescriptions    Medication Sig Dispense Auth. Provider   azithromycin (ZITHROMAX) 250 MG tablet Take 1 tablet (250 mg total) by mouth daily. Take first 2 tablets together, then 1 every day until finished. 6 tablet Gerarda Fraction, Chukwuemeka Artola O, PA-C   albuterol (PROVENTIL HFA;VENTOLIN HFA) 108 (90 Base) MCG/ACT inhaler Inhale 1-2 puffs into the lungs every 6 (six) hours as needed for wheezing or shortness of breath. 1 Inhaler Noe Gens, PA-C     Controlled Substance Prescriptions [place] Controlled Substance Registry consulted? Not Applicable   Tyrell Antonio 12/29/17 [id number]

## 2017-12-27 NOTE — Discharge Instructions (Signed)
°  Please follow up with your family provider in 1 week if not improving, sooner if worsening.

## 2017-12-27 NOTE — ED Triage Notes (Signed)
Patient has a productive cough x 10 days, tried OTC meds w/no relief, only with cough drops, thought it was allergies or acid reflux, right ear fullness, worse @ night.

## 2018-01-25 ENCOUNTER — Ambulatory Visit (INDEPENDENT_AMBULATORY_CARE_PROVIDER_SITE_OTHER): Payer: 59 | Admitting: Physician Assistant

## 2018-01-25 ENCOUNTER — Encounter: Payer: Self-pay | Admitting: Physician Assistant

## 2018-01-25 VITALS — BP 101/63 | HR 74 | Ht 64.02 in | Wt 196.0 lb

## 2018-01-25 DIAGNOSIS — L989 Disorder of the skin and subcutaneous tissue, unspecified: Secondary | ICD-10-CM | POA: Diagnosis not present

## 2018-01-25 DIAGNOSIS — R55 Syncope and collapse: Secondary | ICD-10-CM

## 2018-01-25 DIAGNOSIS — J302 Other seasonal allergic rhinitis: Secondary | ICD-10-CM | POA: Diagnosis not present

## 2018-01-25 DIAGNOSIS — M65342 Trigger finger, left ring finger: Secondary | ICD-10-CM | POA: Diagnosis not present

## 2018-01-25 MED ORDER — ONDANSETRON 4 MG PO TBDP: 4.0000 mg | ORAL_TABLET | Freq: Three times a day (TID) | ORAL | 2 refills | Status: DC | PRN

## 2018-01-25 NOTE — Patient Instructions (Addendum)
Actinic Keratosis An actinic keratosis is a precancerous growth on the skin. This means that it could develop into skin cancer if it is not treated. About 1% of these growths (actinic keratoses) turn into skin cancer within one year if they are not treated. It is important to have all of these growths evaluated to determine the best treatment approach. What are the causes? This condition is caused by getting too much ultraviolet (UV) radiation from the sun or other UV light sources. What increases the risk? The following factors may make you more likely to develop this condition:  Having light-colored skin and blue eyes.  Having blonde or red hair.  Spending a lot of time in the sun.  Inadequate skin protection when outdoors. This may include: ? Not using sunscreen properly. ? Not covering up skin that is exposed to sunlight.  Aging. The risk of developing an actinic keratosis increases with age.  What are the signs or symptoms? Actinic keratoses look like scaly, rough spots of skin.They can be as small as a pinhead or as big as a quarter. They may itch, hurt, or feel sensitive. In most cases, the growths become red. In some cases, they may be skin-colored, light tan, dark tan, pink, or a combination of any of these colors. There may be a small piece of pink or gray skin (skin tag) growing from the actinic keratosis. In some cases, it may be easier to notice actinic keratoses by feeling them, rather than seeing them. Actinic keratoses appear most often on areas of skin that get a lot of sun exposure, including the scalp, face, ears, lips, upper back, forearms, and the backs of the hands. Sometimes, actinic keratoses disappear, but many reappear a few days to a few weeks later. How is this diagnosed? This condition is usually diagnosed with a physical exam. A tissue sample may be removed from the actinic keratosis and examined under a microscope (biopsy). How is this treated?  Treatment for  this condition may include:  Scraping off the actinic keratosis (curettage).  Freezing the actinic keratosis with liquid nitrogen (cryosurgery). This causes the growth to eventually fall off the skin.  Applying medicated creams or gels to destroy the cells in the growth.  Applying chemicals to the actinic keratosis to make the outer layers of skin peel off (chemical peel).  Photodynamic therapy. In this procedure, medicated cream is applied to the actinic keratosis. This cream increases your skin's sensitivity to light. Then, a strong light is aimed at the actinic keratosis to destroy cells in the growth.  Follow these instructions at home: Skin care  Apply cool, wet cloths (cool compresses) to the affected areas.  Do not scratch your skin.  Check your skin regularly for any growths, especially growths that: ? Start to itch or bleed. ? Change in size, shape, or color. Caring for the treated area  Keep the treated area clean and dry as told by your health care provider.  Do not apply any medicine, cream, or lotion to the treated area unless your health care provider tells you to do that.  Do not pick at blisters or try to break them open. This can cause infection and scarring.  If you have red or irritated skin after treatment, follow instructions from your health care provider about how to take care of the treated area. Make sure you: ? Wash your hands with soap and water before you change your bandage (dressing). If soap and water are not available, use  hand sanitizer. ? Change your dressing as told by your health care provider.  If you have red or irritated skin after treatment, check your treated area every day for signs of infection. Check for: ? Swelling, pain, or more redness. ? Fluid or blood. ? Warmth. ? Pus or a bad smell. General instructions  Take over-the-counter and prescription medicines only as told by your health care provider.  Return to your normal  activities as told by your health care provider. Ask your health care provider what activities are safe for you.  Do not use any tobacco products, such as cigarettes, chewing tobacco, and e-cigarettes. If you need help quitting, ask your health care provider.  Have a skin exam done every year by a health care provider who is a skin conditions specialist (dermatologist).  Keep all follow-up visits as told by your health care provider. This is important. How is this prevented?  Do not get sunburns.  Try to avoid the sun between 10:00 a.m. and 4:00 p.m. This is when the UV light is the strongest.  Use a sunscreen or sunblock with SPF 30 (sun protection factor 30) or greater.  Apply sunscreen before you are exposed to sunlight, and reapply periodically as often as directed by the instructions on the sunscreen container.  Always wear sunglasses that have UV protection, and always wear hats and clothing to protect your skin from sunlight.  When possible, avoid medicines that increase your sensitivity to sunlight. These include: ? Certain antibiotic medicines. ? Certain water pills (diuretics). ? Certain prescription medicines that are used to treat acne (retinoids).  Do not use tanning beds or other indoor tanning devices. Contact a health care provider if:  You notice any changes or new growths on your skin.  You have swelling, pain, or more redness around your treated area.  You have fluid or blood coming from your treated area.  Your treated area feels warm to the touch.  You have pus or a bad smell coming from your treated area.  You have a fever.  You have a blister that becomes large and painful. This information is not intended to replace advice given to you by your health care provider. Make sure you discuss any questions you have with your health care provider. Document Released: 10/10/2008 Document Revised: 03/14/2016 Document Reviewed: 03/24/2015 Elsevier Interactive  Patient Education  2018 [employer].    Trigger Finger Trigger finger (stenosing tenosynovitis) is a condition that causes a finger to get stuck in a bent position. Each finger has a tough, cord-like tissue that connects muscle to bone (tendon), and each tendon is surrounded by a tunnel of tissue (tendon sheath). To move your finger, your tendon needs to slide freely through the sheath. Trigger finger happens when the tendon or the sheath thickens, making it difficult to move your finger. Trigger finger can affect any finger or a thumb. It may affect more than one finger. Mild cases may clear up with rest and medicine. Severe cases require more treatment. What are the causes? Trigger finger is caused by a thickened finger tendon or tendon sheath. The cause of this thickening is not known. What increases the risk? The following factors may make you more likely to develop this condition:  Doing activities that require a strong grip.  Having rheumatoid arthritis, gout, or diabetes.  Being 3-19 years old.  Being a woman.  What are the signs or symptoms? Symptoms of this condition include:  Pain when bending or straightening your  finger.  Tenderness or swelling where your finger attaches to the palm of your hand.  A lump in the palm of your hand or on the inside of your finger.  Hearing a popping sound when you try to straighten your finger.  Feeling a popping, catching, or locking sensation when you try to straighten your finger.  Being unable to straighten your finger.  How is this diagnosed? This condition is diagnosed based on your symptoms and a physical exam. How is this treated? This condition may be treated by:  Resting your finger and avoiding activities that make symptoms worse.  Wearing a finger splint to keep your finger in a slightly bent position.  Taking NSAIDs to relieve pain and swelling.  Injecting medicine (steroids) into the tendon sheath to reduce  swelling and irritation. Injections may need to be repeated.  Having surgery to open the tendon sheath. This may be done if other treatments do not work and you cannot straighten your finger. You may need physical therapy after surgery.  Follow these instructions at home:  Use moist heat to help reduce pain and swelling as told by your health care provider.  Rest your finger and avoid activities that make pain worse. Return to normal activities as told by your health care provider.  If you have a splint, wear it as told by your health care provider.  Take over-the-counter and prescription medicines only as told by your health care provider.  Keep all follow-up visits as told by your health care provider. This is important. Contact a health care provider if:  Your symptoms are not improving with home care. Summary  Trigger finger (stenosing tenosynovitis) causes your finger to get stuck in a bent position, and it can make it difficult and painful to straighten your finger.  This condition develops when a finger tendon or tendon sheath thickens.  Treatment starts with resting, wearing a splint, and taking NSAIDs.  In severe cases, surgery to open the tendon sheath may be needed. This information is not intended to replace advice given to you by your health care provider. Make sure you discuss any questions you have with your health care provider. Document Released: 05/03/2004 Document Revised: 06/24/2016 Document Reviewed: 06/24/2016 Elsevier Interactive Patient Education  2017 [employer].

## 2018-01-25 NOTE — Progress Notes (Signed)
Subjective:    Patient ID: Tanya [name], female    DOB: 1971/05/18, 47 y.o.   MRN: [id number]  HPI Pt is a 47 yo female who presents to the clinic to have a lesion looked at and get medication refills.   She has some skin lesions that will not go away. She scratches at it but will not resolve. Does not hurt, itch or bleed. Not tried anything else to make go away. No personal hx of skin cancer.   Pt does also have a catching left ring finger. Hx of trigger finger on the right hand that resolved after injection. Not tried anything to make better.   .. Active Ambulatory Problems    Diagnosis Date Noted  . GERD 07/19/2010  . Dermatitis 07/16/2012  . Seasonal allergies 07/16/2012  . Trigger thumb of left hand 04/17/2014  . IDA (iron deficiency anemia) 07/04/2016  . Syncope and collapse 09/09/2016  . Trigger ring finger of left hand 10/19/2016  . Anxiety 10/19/2016  . Tinnitus of left ear 11/28/2016  . Neck pain on left side 11/28/2016  . Meralgia paraesthetica, left 06/05/2017  . Episodic paroxysmal hemicrania, not intractable 09/06/2017  . Class 1 obesity due to excess calories without serious comorbidity with body mass index (BMI) of 33.0 to 33.9 in adult 09/06/2017  . Dyslipidemia (high LDL; low HDL) 09/22/2017  . Skin lesion 01/27/2018   Resolved Ambulatory Problems    Diagnosis Date Noted  . No Resolved Ambulatory Problems   Past Medical History:  Diagnosis Date  . GERD (gastroesophageal reflux disease)   . Syncope and collapse 09/09/2016      Review of Systems See HPI.     Objective:   Physical Exam  Constitutional: She is oriented to person, place, and time. She appears well-developed and well-nourished.  HENT:  Head: Normocephalic and atraumatic.  Right Ear: External ear normal.  Left Ear: External ear normal.  Cardiovascular: Normal rate, regular rhythm and normal heart sounds.  Pulmonary/Chest: Effort normal and breath sounds normal.  Musculoskeletal:   Left ring finger catching at base of 3rd MCP.  Neurological: She is alert and oriented to person, place, and time.  Skin:  2 lesions on upper chest one is slightly raised and wart like with some fine scales and the other is more flat and erythematous with some fine scales.   Psychiatric: She has a normal mood and affect. Her behavior is normal.          Assessment & Plan:  Marland KitchenMarland KitchenElonda was seen today for follow-up and spot on skin.  Diagnoses and all orders for this visit:  Skin lesion  Syncope and collapse -     ondansetron (ZOFRAN-ODT) 4 MG disintegrating tablet; Take 1 tablet (4 mg total) by mouth every 8 (eight) hours as needed for nausea or vomiting.  Trigger ring finger of left hand  Seasonal allergies  try allegra D for allergies.   Per pt mammogram done. Will call [facility] and get copy of mammogram.   Cryotherapy Procedure Note  Pre-operative Diagnosis: Actinic keratosis vs seborrheic keratosis  Post-operative Diagnosis: Actinic keratosis vs seborrheic keratosis  Locations:upper chest  Indications: pre-cancerous vs irritation  Procedure Details  History of allergy to iodine: no. Pacemaker? no.  Patient informed of risks (permanent scarring, infection, light or dark discoloration, bleeding, infection, weakness, numbness and recurrence of the lesion) and benefits of the procedure and verbal informed consent obtained.  The areas are treated with liquid nitrogen therapy, frozen until ice ball  extended 3 mm beyond lesion, allowed to thaw, and treated again. The patient tolerated procedure well.  The patient was instructed on post-op care, warned that there may be blister formation, redness and pain. Recommend OTC analgesia as needed for pain.  Condition: Stable  Complications: none.  Plan: 1. Instructed to keep the area dry and covered for 24-48h and clean thereafter. 2. Warning signs of infection were reviewed.   3. Recommended that the patient use OTC  acetaminophen as needed for pain.   Discussed splint, NSAID, rest. If not improving in 2 weeks follow up for injection.

## 2018-01-27 ENCOUNTER — Encounter: Payer: Self-pay | Admitting: Physician Assistant

## 2018-01-27 ENCOUNTER — Telehealth: Payer: Self-pay | Admitting: Physician Assistant

## 2018-01-27 DIAGNOSIS — L989 Disorder of the skin and subcutaneous tissue, unspecified: Secondary | ICD-10-CM | POA: Insufficient documentation

## 2018-01-27 NOTE — Telephone Encounter (Signed)
Call [facility] and get mammogram report please.

## 2018-02-01 NOTE — Telephone Encounter (Signed)
Called and was told they needed request faxed. I have sent fax for records for continuity of care to [facility] at [phone]

## 2018-02-08 MED FILL — ONDANSETRON ODT 4 MG TABLET: 4 | 10 days supply | Qty: 30 | Fill #0

## 2018-04-01 ENCOUNTER — Encounter: Payer: Self-pay | Admitting: Physician Assistant

## 2018-04-12 ENCOUNTER — Telehealth: Payer: Self-pay | Admitting: Physician Assistant

## 2018-04-12 NOTE — Telephone Encounter (Signed)
Traditional TB test ok. She just has to come in for read and a lot of people like the ease of blood draw.

## 2018-04-12 NOTE — Telephone Encounter (Signed)
Is there clinical need for blood draw or ok for TB test on nurse schedule?

## 2018-04-12 NOTE — Telephone Encounter (Signed)
Pt called. She wanted to schedule a tb test and mentioned she had her blood drawn at last visit. Patient got Qanti-FERON at last visit per Apolonio Schneiders , and she said to ask you if there was a reason for this if not can patient ger regular tb shot?

## 2018-04-13 NOTE — Telephone Encounter (Signed)
Left vm

## 2018-04-20 ENCOUNTER — Ambulatory Visit (INDEPENDENT_AMBULATORY_CARE_PROVIDER_SITE_OTHER): Payer: 59 | Admitting: Physician Assistant

## 2018-04-20 VITALS — BP 131/80 | HR 80 | Temp 98.2°F | Wt 194.0 lb

## 2018-04-20 DIAGNOSIS — Z111 Encounter for screening for respiratory tuberculosis: Secondary | ICD-10-CM | POA: Diagnosis not present

## 2018-04-20 NOTE — Progress Notes (Signed)
Pt here for PPD placement.  Pt denies ever testing positive for TB and denies any known exposure.   Pt tolerated injection in left arm well without complications.  Advised to schedule appt to have read in 48-72 hours.   Agree with above plan. Iran Planas PA-C

## 2018-04-22 ENCOUNTER — Ambulatory Visit (INDEPENDENT_AMBULATORY_CARE_PROVIDER_SITE_OTHER): Payer: 59 | Admitting: Physician Assistant

## 2018-04-22 ENCOUNTER — Encounter: Payer: Self-pay | Admitting: Physician Assistant

## 2018-04-22 VITALS — BP 127/68 | HR 83 | Temp 98.6°F | Wt 194.0 lb

## 2018-04-22 DIAGNOSIS — Z111 Encounter for screening for respiratory tuberculosis: Secondary | ICD-10-CM | POA: Diagnosis not present

## 2018-04-22 LAB — TB SKIN TEST
Induration: 0 mm
TB Skin Test: NEGATIVE

## 2018-04-22 NOTE — Progress Notes (Signed)
Pt presented today for PPD reading. Results were negative / positive 0 mm. Pt's bp reading was 145/62, pulse 73. Pt sat for 15 mins - second bp reading was 127/68, pulse 83. Pt requesting a letter documenting her negative results for school/work. Letter of results given to pt.   Agree with above plan. Iran Planas PA-C

## 2018-05-17 DIAGNOSIS — Z1239 Encounter for other screening for malignant neoplasm of breast: Secondary | ICD-10-CM | POA: Diagnosis not present

## 2018-05-17 DIAGNOSIS — Z1231 Encounter for screening mammogram for malignant neoplasm of breast: Secondary | ICD-10-CM | POA: Diagnosis not present

## 2018-05-17 LAB — HM MAMMOGRAPHY

## 2018-05-20 ENCOUNTER — Encounter: Payer: Self-pay | Admitting: Physician Assistant

## 2018-07-09 DIAGNOSIS — H524 Presbyopia: Secondary | ICD-10-CM | POA: Diagnosis not present

## 2018-07-09 DIAGNOSIS — H43811 Vitreous degeneration, right eye: Secondary | ICD-10-CM | POA: Diagnosis not present

## 2018-07-09 DIAGNOSIS — H5203 Hypermetropia, bilateral: Secondary | ICD-10-CM | POA: Diagnosis not present

## 2018-07-09 NOTE — Telephone Encounter (Signed)
Please call pt and see if has had or needs flu shot

## 2018-07-12 NOTE — Telephone Encounter (Signed)
Left pt msg asking she call back and let me know if/when she had flu shot

## 2018-07-13 NOTE — Telephone Encounter (Signed)
Pt called to let us know flu shot given 04/10/2018 at work. Updated in chart. Pt not willing to schedule CPE at this time

## 2018-09-09 ENCOUNTER — Emergency Department
Admission: EM | Admit: 2018-09-09 | Discharge: 2018-09-09 | Disposition: A | Discharge status: Home / Self Care | Payer: 59 | Source: Home / Self Care

## 2018-09-09 ENCOUNTER — Other Ambulatory Visit: Payer: Self-pay

## 2018-09-09 DIAGNOSIS — H6690 Otitis media, unspecified, unspecified ear: Secondary | ICD-10-CM

## 2018-09-09 DIAGNOSIS — H9201 Otalgia, right ear: Secondary | ICD-10-CM

## 2018-09-09 MED ORDER — AMOXICILLIN 500 MG PO CAPS: 500.0000 mg | ORAL_CAPSULE | Freq: Three times a day (TID) | ORAL | 0 refills | Status: DC

## 2018-09-09 NOTE — Discharge Instructions (Signed)
Return if any problems.

## 2018-09-09 NOTE — ED Triage Notes (Signed)
Pt c/o RT ear pain x 1 week. Also says she can hear a crackling sound when she lays down sometimes. Had a cold that started over a week ago and has lingering cough in addition to the earache. Taking tussin DM prn.

## 2018-09-10 NOTE — ED Provider Notes (Signed)
[name]    CSN: [id number] Arrival date & time: 09/09/18  1538     History   Chief Complaint Chief Complaint  Patient presents with  . Otalgia    RT ear    HPI Tanya [name] is a 48 y.o. female.   The history is provided by the patient. No language interpreter was used.  Otalgia  Location:  Right Quality:  Aching Severity:  Moderate Onset quality:  Gradual Duration:  1 week Timing:  Constant Progression:  Worsening Chronicity:  New Relieved by:  Nothing Worsened by:  Nothing Ineffective treatments:  None tried Associated symptoms: congestion   Associated symptoms: no abdominal pain     Past Medical History:  Diagnosis Date  . GERD (gastroesophageal reflux disease)   . Syncope and collapse 09/09/2016    Patient Active Problem List   Diagnosis Date Noted  . Skin lesion 01/27/2018  . Dyslipidemia (high LDL; low HDL) 09/22/2017  . Episodic paroxysmal hemicrania, not intractable 09/06/2017  . Class 1 obesity due to excess calories without serious comorbidity with body mass index (BMI) of 33.0 to 33.9 in adult 09/06/2017  . Meralgia paraesthetica, left 06/05/2017  . Tinnitus of left ear 11/28/2016  . Neck pain on left side 11/28/2016  . Trigger ring finger of left hand 10/19/2016  . Anxiety 10/19/2016  . Syncope and collapse 09/09/2016  . IDA (iron deficiency anemia) 07/04/2016  . Trigger thumb of left hand 04/17/2014  . Dermatitis 07/16/2012  . Seasonal allergies 07/16/2012  . GERD 07/19/2010    Past Surgical History:  Procedure Laterality Date  . FINGER SURGERY    . TENDON REPAIR     RT 4th digit  . tri mallear      OB History   No obstetric history on file.      Home Medications    Prior to Admission medications   Medication Sig Start Date End Date Taking? Authorizing Provider  amoxicillin (AMOXIL) 500 MG capsule Take 1 capsule (500 mg total) by mouth 3 (three) times daily. 09/09/18   Fransico Meadow, PA-C  calcium carbonate  (TUMS EX) 750 MG chewable tablet Chew 1 tablet by mouth as needed. Takes 3-4 tablets    [provider]  fluticasone (FLONASE) 50 MCG/ACT nasal spray Place 1 spray into both nostrils daily.    [provider]  meclizine (ANTIVERT) 32 MG tablet Take 1 tablet (32 mg total) by mouth 3 (three) times daily as needed. 10/17/16   Breeback, Luvenia Starch L, PA-C  Naproxen Sodium (ALEVE PO) Take 2 tablets by mouth as needed.     [provider]  omeprazole (PRILOSEC OTC) 20 MG tablet Take 20 mg daily by mouth.    [provider]  ondansetron (ZOFRAN-ODT) 4 MG disintegrating tablet Take 1 tablet (4 mg total) by mouth every 8 (eight) hours as needed for nausea or vomiting. 01/25/18   Donella Stade, PA-C    Family History Family History  Problem Relation Age of Onset  . Cancer Father        prostate  . Heart attack Maternal Grandmother   . Hypertension Maternal Grandmother   . Parkinson's disease Maternal Grandfather     Social History Social History   Tobacco Use  . Smoking status: Never Smoker  . Smokeless tobacco: Never Used  Substance Use Topics  . Alcohol use: Yes  . Drug use: No     Allergies   Other   Review of Systems Review of Systems  HENT: Positive for congestion and ear pain.   Gastrointestinal: Negative for abdominal pain.  All other systems reviewed and are negative.    Physical Exam Triage Vital Signs ED Triage Vitals [09/09/18 1555]  Enc Vitals Group     BP 133/86     Pulse Rate 85     Resp 16     Temp 98.2 F (36.8 C)     Temp Source Oral     SpO2 98 %     Weight      Height 5\' 4"  (1.626 m)     Head Circumference      Peak Flow      Pain Score 0     Pain Loc      Pain Edu?      Excl. in [place]?    No data found.  Updated Vital Signs BP 133/86 (BP Location: Right Arm)   Pulse 85   Temp 98.2 F (36.8 C) (Oral)   Resp 16   Ht 5\' 4"  (1.626 m)   LMP  (LMP Unknown)   SpO2 98%   BMI 33.30 kg/m   Visual Acuity Right Eye  Distance:   Left Eye Distance:   Bilateral Distance:    Right Eye Near:   Left Eye Near:    Bilateral Near:     Physical Exam Vitals signs and nursing note reviewed.  Constitutional:      Appearance: She is well-developed.  HENT:     Head: Normocephalic.     Right Ear: Tympanic membrane normal.     Ears:     Comments: Erythema left tm.  Bulging     Nose: Nose normal.     Mouth/Throat:     Mouth: Mucous membranes are moist.  Eyes:     Pupils: Pupils are equal, round, and reactive to light.  Neck:     Musculoskeletal: Normal range of motion.  Cardiovascular:     Rate and Rhythm: Normal rate.     Pulses: Normal pulses.  Pulmonary:     Effort: Pulmonary effort is normal.  Abdominal:     General: There is no distension.  Musculoskeletal: Normal range of motion.  Skin:    General: Skin is warm.  Neurological:     Mental Status: She is alert and oriented to person, place, and time.  Psychiatric:        Mood and Affect: Mood normal.      UC Treatments / Results  Labs (all labs ordered are listed, but only abnormal results are displayed) Labs Reviewed - No data to display  EKG None  Radiology No results found.  Procedures Procedures (including critical care time)  Medications Ordered in UC Medications - No data to display  Initial Impression / Assessment and Plan / UC Course  I have reviewed the triage vital signs and the nursing notes.  Pertinent labs & imaging results that were available during my care of the patient were reviewed by me and considered in my medical decision making (see chart for details).     MDM   Final Clinical Impressions(s) / UC Diagnoses   Final diagnoses:  Acute otitis media, unspecified otitis media type  Otalgia of right ear     Discharge Instructions     Return if any problems.     ED Prescriptions    Medication Sig Dispense Auth. Provider   amoxicillin (AMOXIL) 500 MG capsule  (Status: Discontinued) Take 1 capsule  (500 mg total) by mouth 3 (three)  times daily. 30 capsule Bryon Parker K, [place]   amoxicillin (AMOXIL) 500 MG capsule Take 1 capsule (500 mg total) by mouth 3 (three) times daily. 30 capsule Fransico Meadow, [place]     Controlled Substance Prescriptions [place] Controlled Substance Registry consulted? Not Applicable   Fransico Meadow, [place] 09/10/18 1617

## 2018-10-13 IMAGING — MR MR THORACIC SPINE WO/W CM
8 of 13 series · 25 of 48 positions shown · IV contrast (multihance)
Comparison: None.

CLINICAL DATA: Paravertebral cyst.

EXAM:
MRI THORACIC WITHOUT AND WITH CONTRAST
TECHNIQUE: Multiplanar and multiecho pulse sequences of the thoracic spine were
obtained without and with intravenous contrast.
CONTRAST:  15mL MULTIHANCE GADOBENATE DIMEGLUMINE 529 MG/ML IV SOLN

[Series 3: T1 · sagittal · 3.0mm · 1.06mm/px · 1 of 11 slices shown (1 of 4)]
[im 1/11]
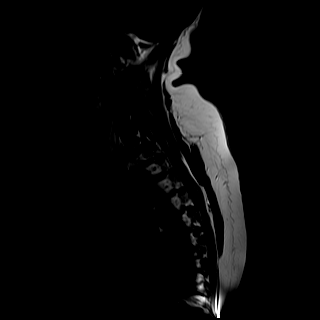

[Series 4: T1 · sagittal · 4.0mm · 0.50mm/px · 2 of 16 slices shown (2 of 4)]
[im 1/16]
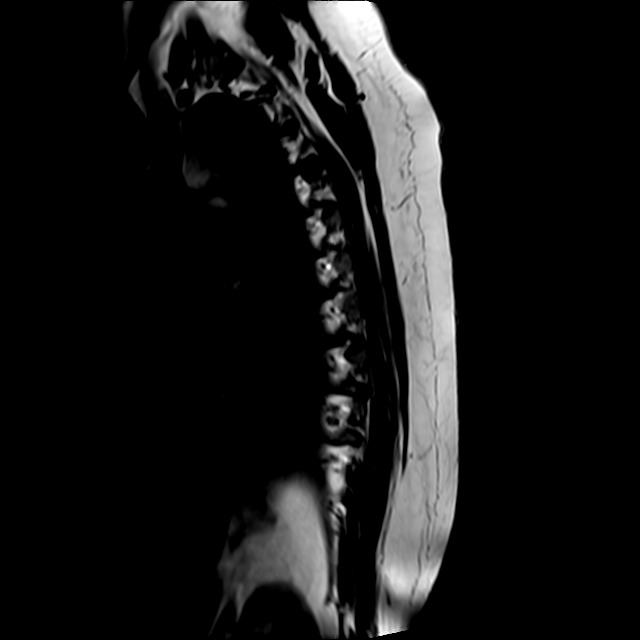
[im 16/16]
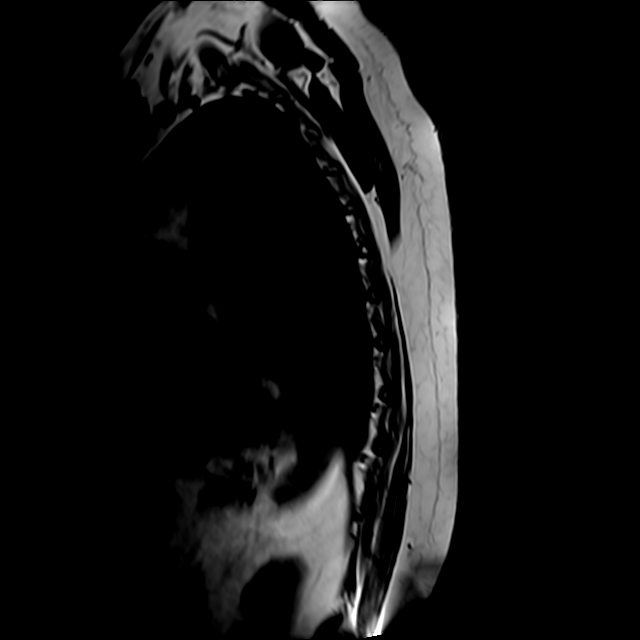

[Series 6: T2 · axial · 4.0mm · 0.66mm/px · z∈[-123,-8]mm · 5 of 26 slices shown (1 of 3)]
[im 1/26]
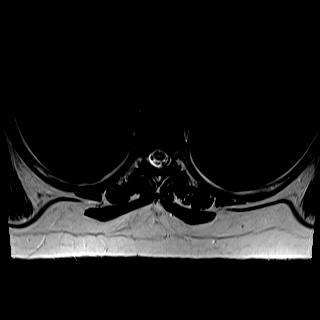
[im 7/26]
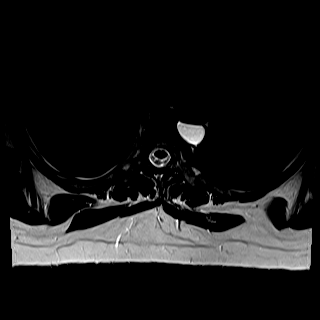
[im 13/26]
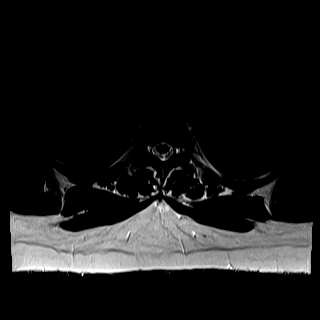
[im 19/26]
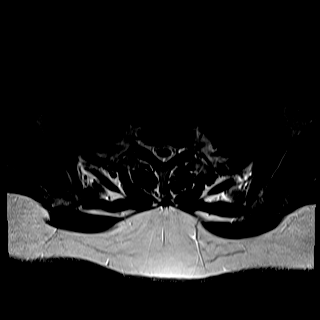
[im 26/26]
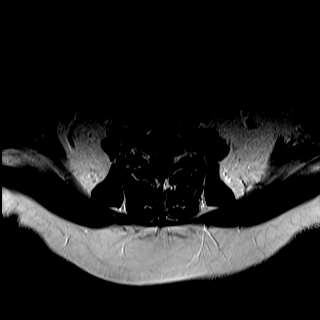

[Series 7: T2 · axial · 4.0mm · 0.66mm/px · z∈[-242,-113]mm · 4 of 21 slices shown (2 of 3)]
[im 1/21]
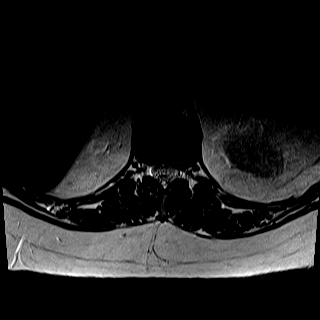
[im 7/21]
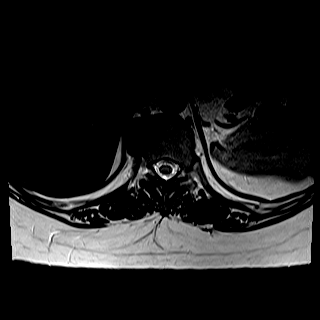
[im 14/21]
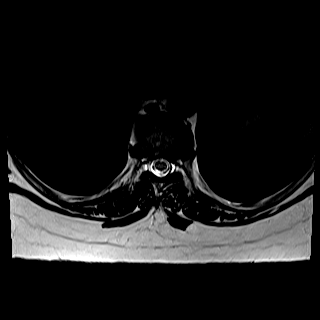
[im 21/21]
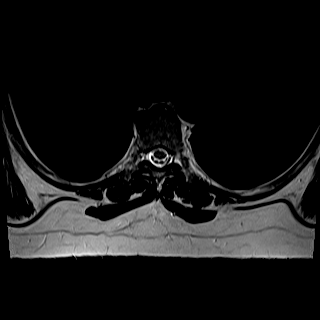

[Series 10: T1 · axial · 4.0mm · 0.39mm/px · z∈[-123,-6]mm · 5 of 26 slices shown (3 of 4)]
[im 1/26]
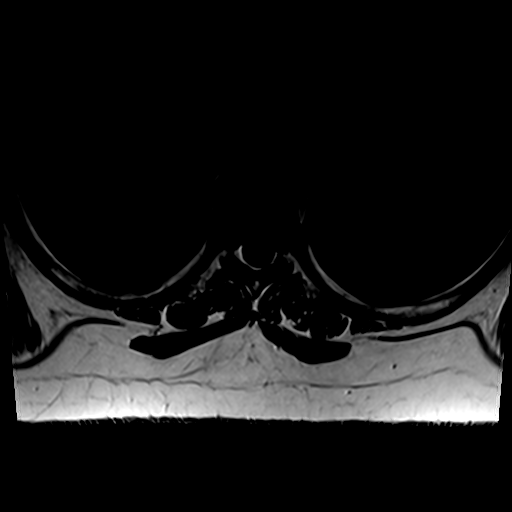
[im 7/26]
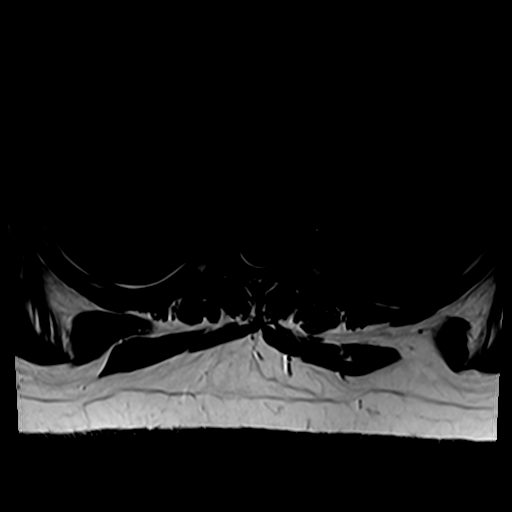
[im 13/26]
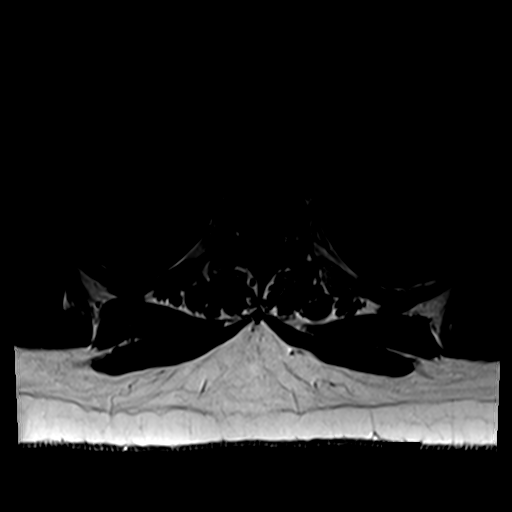
[im 19/26]
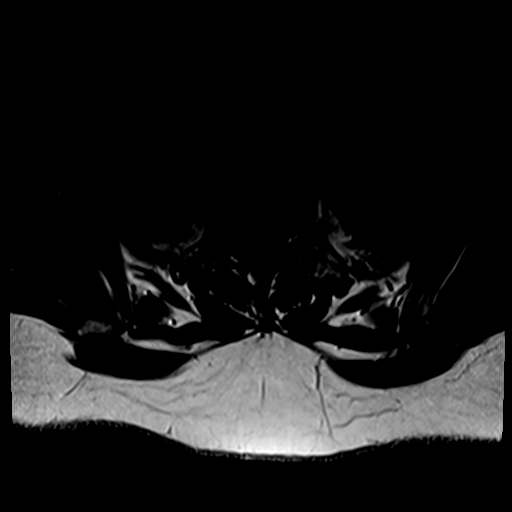
[im 26/26]
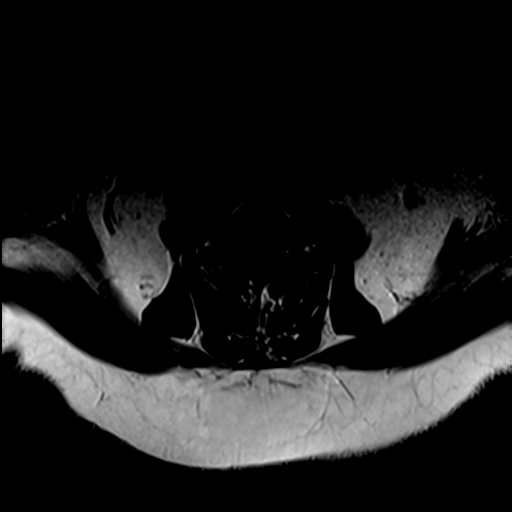

[Series 11: T1 · axial · 4.0mm · 0.39mm/px · z∈[-243,-113]mm · 4 of 21 slices shown (4 of 4)]
[im 1/21]
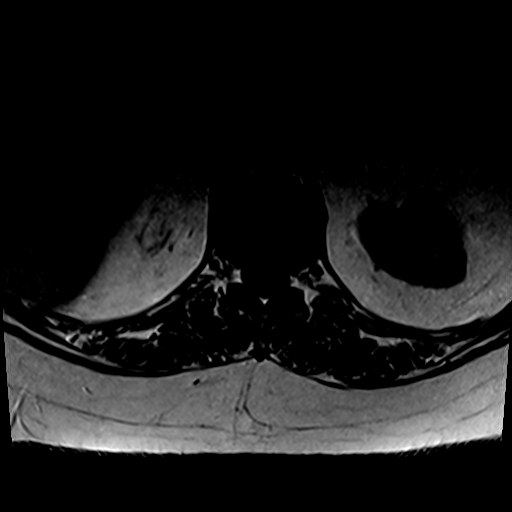
[im 7/21]
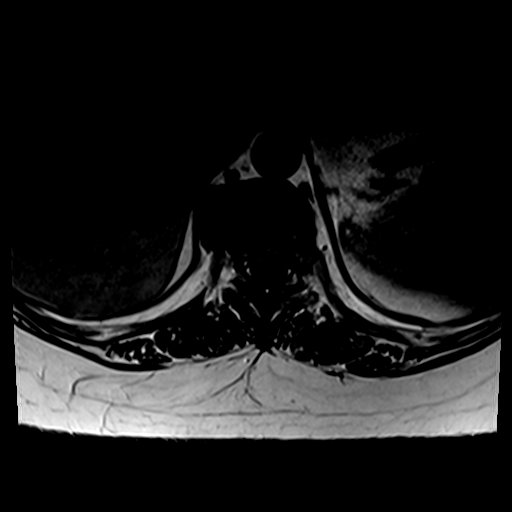
[im 14/21]
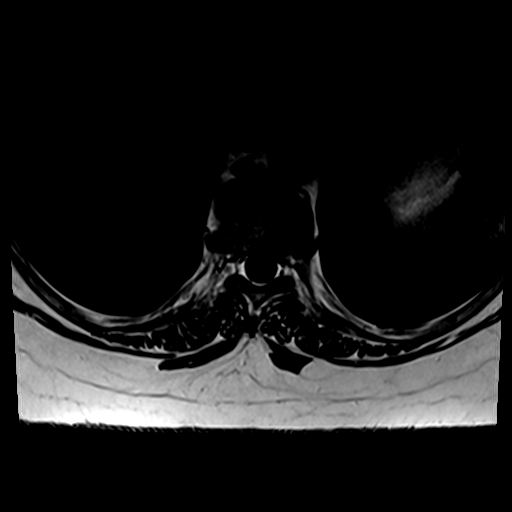
[im 21/21]
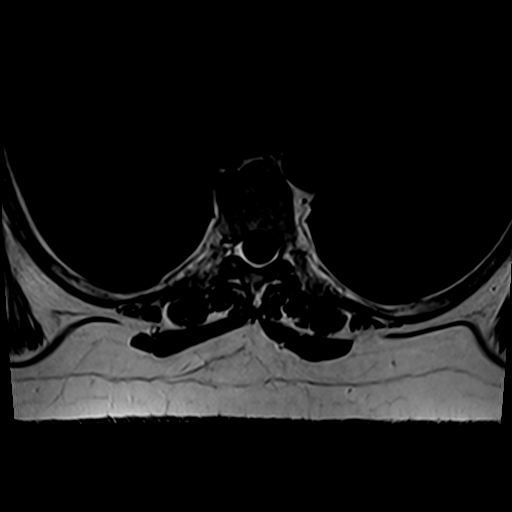

[Series 12: T2 · sagittal · 4.0mm · 0.83mm/px · 3 of 16 slices shown (3 of 3)]
[im 1/16]
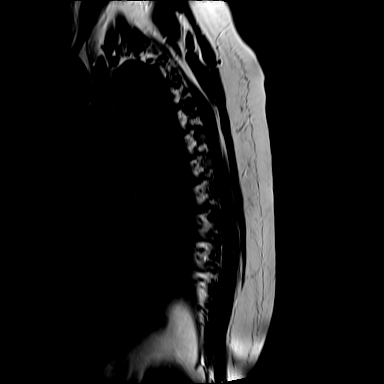
[im 8/16]
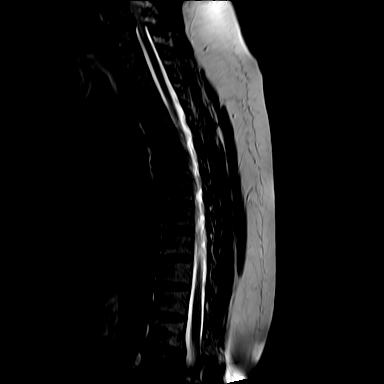
[im 16/16]
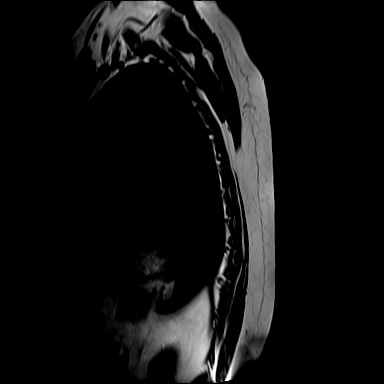

[Series 13: T1 fat-sat · sagittal · 4.0mm · 0.50mm/px · 1 of 16 slices shown]
[im 1/16]
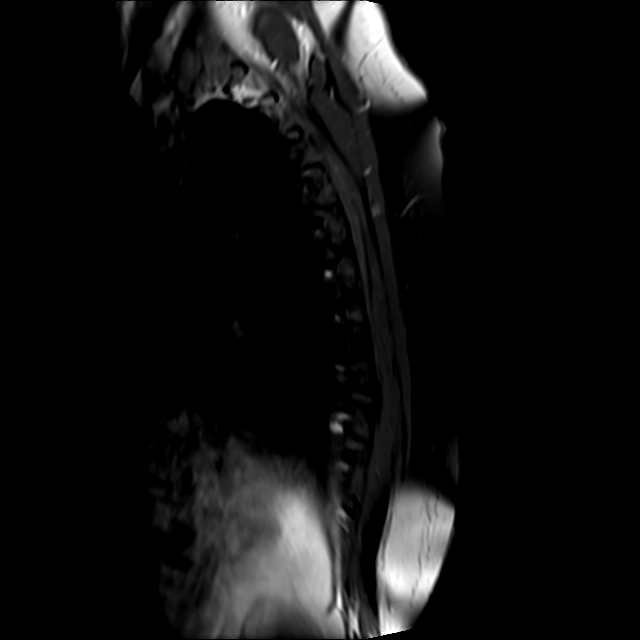

[25 of 48 positions shown; findings below may reference images not displayed]

FINDINGS: MRI THORACIC SPINE FINDINGS

Alignment:  Anatomic

Vertebrae: No worrisome osseous lesion.

Cord:  Normal signal and morphology.

Paraspinal and other soft tissues: There is a T1 hypointense, T2
hyperintense, CSF isointense, LEFT paravertebral, posterior
mediastinal cyst, without septation or significant enhancement.
Measurements are 21 x 14 x 25 mm. There is no communication with the
spinal canal or neural foramen.

Disc levels:  No thoracic disc protrusion or spinal stenosis.
IMPRESSION: CSF isointense LEFT paravertebral posterior mediastinal cyst, 21 x
14 x 25 mm. No visible communication with spinal canal or neural
foramen. No postcontrast enhancement.

Most likely considerations include a bronchogenic cyst, esophageal
duplication cyst, neurenteric cyst, or lymphangioma.

## 2019-07-11 DIAGNOSIS — H5203 Hypermetropia, bilateral: Secondary | ICD-10-CM | POA: Diagnosis not present

## 2019-07-11 DIAGNOSIS — H31093 Other chorioretinal scars, bilateral: Secondary | ICD-10-CM | POA: Diagnosis not present

## 2019-07-18 DIAGNOSIS — Z1231 Encounter for screening mammogram for malignant neoplasm of breast: Secondary | ICD-10-CM | POA: Diagnosis not present

## 2019-07-18 DIAGNOSIS — Z1239 Encounter for other screening for malignant neoplasm of breast: Secondary | ICD-10-CM | POA: Diagnosis not present

## 2019-07-18 LAB — HM MAMMOGRAPHY

## 2019-07-19 LAB — HM MAMMOGRAPHY

## 2019-08-05 ENCOUNTER — Encounter: Payer: Self-pay | Admitting: Physician Assistant

## 2019-12-11 NOTE — Progress Notes (Signed)
HPI: Tanya [name] is a 49 y.o. female who  has a past medical history of GERD (gastroesophageal reflux disease) and Syncope and collapse (09/09/2016).  she presents to [facility] today, 12/12/19,  for chief complaint of: Annual physical exam  HRT- no menstrual cycle since November 2020. Having frequent hot flashes. Has questions about starting HRT.  Hand pain- having right index finger pain with flexion that limits overall range of motion. History of trigger finger injected by Dr. Darene Lamer.  Anxiety- very tearful and anxious. Having marital difficulties and is experiencing a lot of unknowns. Denies SI/HI.  Chest pain- intermittent dull chest pains on the left, varying location from upper chest, through breast, and below left breast under the edge of her ribs. Not associated with breathing, activity or rest. Pain described as dull, very brief, superficial. No SOB, nausea, or diaphoresis. Occurs randomly, approx 2-4 times per week. Strong family history of heart problems.  Past medical, surgical, social and family history reviewed:  Patient Active Problem List   Diagnosis Date Noted  . Skin lesion 01/27/2018  . Dyslipidemia (high LDL; low HDL) 09/22/2017  . Episodic paroxysmal hemicrania, not intractable 09/06/2017  . Class 1 obesity due to excess calories without serious comorbidity with body mass index (BMI) of 33.0 to 33.9 in adult 09/06/2017  . Meralgia paraesthetica, left 06/05/2017  . Tinnitus of left ear 11/28/2016  . Neck pain on left side 11/28/2016  . Trigger ring finger of left hand 10/19/2016  . Anxiety 10/19/2016  . Syncope and collapse 09/09/2016  . IDA (iron deficiency anemia) 07/04/2016  . Trigger thumb of left hand 04/17/2014  . Dermatitis 07/16/2012  . Seasonal allergies 07/16/2012  . GERD 07/19/2010    Past Surgical History:  Procedure Laterality Date  . FINGER SURGERY    . TENDON REPAIR     RT 4th digit  . tri mallear       Social History   Tobacco Use  . Smoking status: Never Smoker  . Smokeless tobacco: Never Used  Substance Use Topics  . Alcohol use: Not Currently    Family History  Problem Relation Age of Onset  . Cancer Father        prostate  . Heart attack Maternal Grandmother   . Hypertension Maternal Grandmother   . Parkinson's disease Maternal Grandfather      Current medication list and allergy/intolerance information reviewed:    Current Outpatient Medications  Medication Sig Dispense Refill  . APPLE CIDER VINEGAR PO Take 1 tablet by mouth daily. gummies    . Brompheniramine-Phenylephrine (DIMETAPP COLD/ALLERGY PO) Take 1 tablet by mouth daily as needed.    . calcium carbonate (TUMS EX) 750 MG chewable tablet Chew 1 tablet by mouth as needed. Takes 3-4 tablets    . fluticasone (FLONASE) 50 MCG/ACT nasal spray Place 1 spray into both nostrils daily.    . meclizine (ANTIVERT) 32 MG tablet Take 1 tablet (32 mg total) by mouth 3 (three) times daily as needed. 30 tablet 2  . Naproxen Sodium (ALEVE PO) Take 2 tablets by mouth as needed.     Marland Kitchen omeprazole (PRILOSEC OTC) 20 MG tablet Take 20 mg daily by mouth.    . ondansetron (ZOFRAN-ODT) 4 MG disintegrating tablet Take 1 tablet (4 mg total) by mouth every 8 (eight) hours as needed for nausea or vomiting. 30 tablet 2   No current facility-administered medications for this visit.   Facility-Administered Medications Ordered in Other Visits  Medication  Dose Route Frequency Provider Last Rate Last Admin  . gadopentetate dimeglumine (MAGNEVIST) injection 17 mL  17 mL Intravenous Once PRN Kathrynn Ducking, MD        Allergies  Allergen Reactions  . Other Other (See Comments)    Cats per pt       Review of Systems:  Constitutional:  No  fever, no chills, No recent illness, No unintentional weight changes. No significant fatigue.   HEENT: No  headache, no vision change, no hearing change, No sore throat, No  sinus  pressure  Cardiac: + left chest pain (see HPI), No  pressure, No palpitations, No  Orthopnea  Respiratory:  No  shortness of breath. No Cough  Gastrointestinal: No  abdominal pain, No  nausea, No  vomiting,  No  blood in stool, No  diarrhea, +  constipation   Musculoskeletal: No new myalgia/arthralgia  Skin: No  Rash, + small red macular to medial popliteal with intact 47m round scab, no drainage.  Genitourinary: No  incontinence, No  abnormal genital bleeding, No abnormal genital discharge  Hem/Onc: No  easy bruising/bleeding, No  abnormal lymph node  Endocrine: No cold intolerance,  No heat intolerance. No polyuria/polydipsia/polyphagia   Neurologic: No  weakness, No  dizziness, No  slurred speech/focal weakness/facial droop  Psychiatric: No  concerns with depression, + concerns with anxiety, No sleep problems, No mood problems  Exam:  BP 137/77   Pulse 81   Temp 98 F (36.7 C) (Oral)   Ht '5\' 3"'$  (1.6 m)   Wt 186 lb 12.8 oz (84.7 kg)   SpO2 99%   BMI 33.09 kg/m   Constitutional: VS see above. General Appearance: alert, well-developed, well-nourished, NAD  Eyes: Normal lids and conjunctive, non-icteric sclera  Ears, Nose, Mouth, Throat: MMM, Normal external inspection ears/nares/mouth/lips/gums. TM normal bilaterally. Pharynx/tonsils no erythema, no exudate. Nasal mucosa normal.   Neck: No masses, trachea midline. No thyroid enlargement. No tenderness/mass appreciated. No lymphadenopathy  Respiratory: Normal respiratory effort. no wheeze, no rhonchi, no rales  Cardiovascular: S1/S2 normal, no murmur, no rub/gallop auscultated. RRR. No lower extremity edema. Pedal pulse II/IV bilaterally DP and PT. No carotid bruit or JVD. No abdominal aortic bruit.  Gastrointestinal: Nontender, no masses. No hepatomegaly, no splenomegaly. No hernia appreciated. Bowel sounds normal. Rectal exam deferred.   Musculoskeletal: Gait normal. No clubbing/cyanosis of digits.   Neurological:  Normal balance/coordination. No tremor. No cranial nerve deficit on limited exam. Motor and sensation intact and symmetric. Cerebellar reflexes intact.   Skin: warm, dry, intact. No rash/ulcer. No concerning nevi or subq nodules on limited exam.    Psychiatric: Normal judgment/insight. Tearful. Oriented x3.   EKG- normal rate, rhythm, and axis. No acute changes.  No results found for this or any previous visit (from the past 72 hour(s)).  No results found.   ASSESSMENT/PLAN:   1. Annual physical exam Checking CBC, CMP, and Lipid panel. - CBC - COMPLETE METABOLIC PANEL WITH GFR - Lipid panel  2. Screening for colon cancer Cologuard ordered. - Cologuard  3. Anxiety Referral to behavioral health for counseling. Psych ER information provided. - [facility]ulatory referral to [facility] 4. Chest pain, unspecified type EKG in office- normal rate, rhythm, axis. Advised to monitor pain in relation to activity and situation as this might help pinpoint etiology. - EKG 12-Lead  5. Skin lesion Likely dermatofibroma. Advised to avoid picking at it and continue to monitor for change in size, color, and shape. If changes noted, return for  further evaluation.  6. Hot flashes Advised to talk with PCP about starting HRT.  7. Trigger index finger of right hand Referred to Dr. Darene Lamer for evaluation and possible injection  Orders Placed This Encounter  Procedures  . CBC  . COMPLETE METABOLIC PANEL WITH GFR  . Lipid panel  . Cologuard  . Ambulatory referral to [facility]  . EKG 12-Lead    No orders of the defined types were placed in this encounter.   Patient Instructions  Preventive Care 15-91 Years Old, Female Preventive care refers to visits with your health care provider and lifestyle choices that can promote health and wellness. This includes:  A yearly physical exam. This may also be called an annual well check.  Regular dental visits and eye  exams.  Immunizations.  Screening for certain conditions.  Healthy lifestyle choices, such as eating a healthy diet, getting regular exercise, not using drugs or products that contain nicotine and tobacco, and limiting alcohol use. What can I expect for my preventive care visit? Physical exam Your health care provider will check your:  Height and weight. This may be used to calculate body mass index (BMI), which tells if you are at a healthy weight.  Heart rate and blood pressure.  Skin for abnormal spots. Counseling Your health care provider may ask you questions about your:  Alcohol, tobacco, and drug use.  Emotional well-being.  Home and relationship well-being.  Sexual activity.  Eating habits.  Work and work Statistician.  Method of birth control.  Menstrual cycle.  Pregnancy history. What immunizations do I need?  Influenza (flu) vaccine  This is recommended every year. Tetanus, diphtheria, and pertussis (Tdap) vaccine  You may need a Td booster every 10 years. Varicella (chickenpox) vaccine  You may need this if you have not been vaccinated. Zoster (shingles) vaccine  You may need this after age 69. Measles, mumps, and rubella (MMR) vaccine  You may need at least one dose of MMR if you were born in 1957 or later. You may also need a second dose. Pneumococcal conjugate (PCV13) vaccine  You may need this if you have certain conditions and were not previously vaccinated. Pneumococcal polysaccharide (PPSV23) vaccine  You may need one or two doses if you smoke cigarettes or if you have certain conditions. Meningococcal conjugate (MenACWY) vaccine  You may need this if you have certain conditions. Hepatitis A vaccine  You may need this if you have certain conditions or if you travel or work in places where you may be exposed to hepatitis A. Hepatitis B vaccine  You may need this if you have certain conditions or if you travel or work in places where  you may be exposed to hepatitis B. Haemophilus influenzae type b (Hib) vaccine  You may need this if you have certain conditions. Human papillomavirus (HPV) vaccine  If recommended by your health care provider, you may need three doses over 6 months. You may receive vaccines as individual doses or as more than one vaccine together in one shot (combination vaccines). Talk with your health care provider about the risks and benefits of combination vaccines. What tests do I need? Blood tests  Lipid and cholesterol levels. These may be checked every 5 years, or more frequently if you are over 98 years old.  Hepatitis C test.  Hepatitis B test. Screening  Lung cancer screening. You may have this screening every year starting at age 21 if you have a 30-pack-year history of smoking and currently  smoke or have quit within the past 15 years.  Colorectal cancer screening. All adults should have this screening starting at age 21 and continuing until age 10. Your health care provider may recommend screening at age 48 if you are at increased risk. You will have tests every 1-10 years, depending on your results and the type of screening test.  Diabetes screening. This is done by checking your blood sugar (glucose) after you have not eaten for a while (fasting). You may have this done every 1-3 years.  Mammogram. This may be done every 1-2 years. Talk with your health care provider about when you should start having regular mammograms. This may depend on whether you have a family history of breast cancer.  BRCA-related cancer screening. This may be done if you have a family history of breast, ovarian, tubal, or peritoneal cancers.  Pelvic exam and Pap test. This may be done every 3 years starting at age 51. Starting at age 49, this may be done every 5 years if you have a Pap test in combination with an HPV test. Other tests  Sexually transmitted disease (STD) testing.  Bone density scan. This is  done to screen for osteoporosis. You may have this scan if you are at high risk for osteoporosis. Follow these instructions at home: Eating and drinking  Eat a diet that includes fresh fruits and vegetables, whole grains, lean protein, and low-fat dairy.  Take vitamin and mineral supplements as recommended by your health care provider.  Do not drink alcohol if: ? Your health care provider tells you not to drink. ? You are pregnant, may be pregnant, or are planning to become pregnant.  If you drink alcohol: ? Limit how much you have to 0-1 drink a day. ? Be aware of how much alcohol is in your drink. In the [place] one drink equals one 12 oz bottle of beer (355 mL), one 5 oz glass of wine (148 mL), or one 1 oz glass of hard liquor (44 mL). Lifestyle  Take daily care of your teeth and gums.  Stay active. Exercise for at least 30 minutes on 5 or more days each week.  Do not use any products that contain nicotine or tobacco, such as cigarettes, e-cigarettes, and chewing tobacco. If you need help quitting, ask your health care provider.  If you are sexually active, practice safe sex. Use a condom or other form of birth control (contraception) in order to prevent pregnancy and STIs (sexually transmitted infections).  If told by your health care provider, take low-dose aspirin daily starting at age 71. What's next?  Visit your health care provider once a year for a well check visit.  Ask your health care provider how often you should have your eyes and teeth checked.  Stay up to date on all vaccines. This information is not intended to replace advice given to you by your health care provider. Make sure you discuss any questions you have with your health care provider. Document Revised: 03/25/2018 Document Reviewed: 03/25/2018 Elsevier Patient Education  2020 [employer].  For immediate mental health services:   [facility], [address] [place], [place]  [address], [facility], [address] [place], [place] [address], [phone] Any emergency room  National Suicide Prevention Lifeline, 6083750634   Follow-up plan: Return in about 1 year (around 12/11/2020) for annual physical exam.  Clearnce Sorrel, DNP, APRN, FNP-BC [facility]

## 2019-12-11 NOTE — Patient Instructions (Signed)

## 2019-12-12 ENCOUNTER — Encounter: Payer: Self-pay | Admitting: Medical-Surgical

## 2019-12-12 ENCOUNTER — Ambulatory Visit (INDEPENDENT_AMBULATORY_CARE_PROVIDER_SITE_OTHER): Payer: 59 | Admitting: Medical-Surgical

## 2019-12-12 ENCOUNTER — Encounter: Payer: Self-pay | Admitting: Physician Assistant

## 2019-12-12 ENCOUNTER — Encounter: Payer: 59 | Admitting: Physician Assistant

## 2019-12-12 VITALS — BP 137/77 | HR 81 | Temp 98.0°F | Ht 63.0 in | Wt 186.8 lb

## 2019-12-12 DIAGNOSIS — L989 Disorder of the skin and subcutaneous tissue, unspecified: Secondary | ICD-10-CM

## 2019-12-12 DIAGNOSIS — Z1211 Encounter for screening for malignant neoplasm of colon: Secondary | ICD-10-CM | POA: Diagnosis not present

## 2019-12-12 DIAGNOSIS — Z0000 Encounter for general adult medical examination without abnormal findings: Secondary | ICD-10-CM

## 2019-12-12 DIAGNOSIS — F419 Anxiety disorder, unspecified: Secondary | ICD-10-CM

## 2019-12-12 DIAGNOSIS — R079 Chest pain, unspecified: Secondary | ICD-10-CM

## 2019-12-12 DIAGNOSIS — M65321 Trigger finger, right index finger: Secondary | ICD-10-CM

## 2019-12-12 DIAGNOSIS — R232 Flushing: Secondary | ICD-10-CM

## 2019-12-18 LAB — COMPLETE METABOLIC PANEL WITH GFR
AG Ratio: 2 (calc) (ref 1.0–2.5)
ALT: 9 U/L (ref 6–29)
AST: 21 U/L (ref 10–35)
Albumin: 4.6 g/dL (ref 3.6–5.1)
Alkaline phosphatase (APISO): 84 U/L (ref 37–153)
BUN/Creatinine Ratio: 27 (calc) — ABNORMAL HIGH (ref 6–22)
BUN: 28 mg/dL — ABNORMAL HIGH (ref 7–25)
CO2: 27 mmol/L (ref 20–32)
Calcium: 9.6 mg/dL (ref 8.6–10.4)
Chloride: 102 mmol/L (ref 98–110)
Creat: 1.04 mg/dL (ref 0.50–1.05)
GFR, Est African American: 72 mL/min/{1.73_m2} (ref >=60)
GFR, Est Non African American: 62 mL/min/{1.73_m2} (ref >=60)
Globulin: 2.3 g/dL (calc) (ref 1.9–3.7)
Glucose, Bld: 82 mg/dL (ref 65–99)
Potassium: 4.6 mmol/L (ref 3.5–5.3)
Sodium: 141 mmol/L (ref 135–146)
Total Bilirubin: 0.6 mg/dL (ref 0.2–1.2)
Total Protein: 6.9 g/dL (ref 6.1–8.1)

## 2019-12-18 LAB — LIPID PANEL
Cholesterol: 252 mg/dL — ABNORMAL HIGH (ref <200)
HDL: 54 mg/dL (ref >=50)
LDL Cholesterol (Calc): 175 mg/dL (calc) — ABNORMAL HIGH
Non-HDL Cholesterol (Calc): 198 mg/dL (calc) — ABNORMAL HIGH (ref <130)
Total CHOL/HDL Ratio: 4.7 (calc) (ref <5.0)
Triglycerides: 107 mg/dL (ref <150)

## 2019-12-18 LAB — IRON, TOTAL/TOTAL IRON BINDING CAP
%SAT: 15 % (calc) — ABNORMAL LOW (ref 16–45)
Iron: 59 ug/dL (ref 45–160)
TIBC: 405 mcg/dL (calc) (ref 250–450)

## 2019-12-18 LAB — CBC
HCT: 41.1 % (ref 35.0–45.0)
Hemoglobin: 13.1 g/dL (ref 11.7–15.5)
MCH: 24.5 pg — ABNORMAL LOW (ref 27.0–33.0)
MCHC: 31.9 g/dL — ABNORMAL LOW (ref 32.0–36.0)
MCV: 76.8 fL — ABNORMAL LOW (ref 80.0–100.0)
MPV: 12.6 fL — ABNORMAL HIGH (ref 7.5–12.5)
Platelets: 235 10*3/uL (ref 140–400)
RBC: 5.35 10*6/uL — ABNORMAL HIGH (ref 3.80–5.10)
RDW: 14.3 % (ref 11.0–15.0)
WBC: 7.7 10*3/uL (ref 3.8–10.8)

## 2019-12-18 LAB — FERRITIN: Ferritin: 14 ng/mL — ABNORMAL LOW (ref 16–232)

## 2019-12-20 ENCOUNTER — Ambulatory Visit (INDEPENDENT_AMBULATORY_CARE_PROVIDER_SITE_OTHER): Payer: 59 | Admitting: Psychology

## 2019-12-20 DIAGNOSIS — F4321 Adjustment disorder with depressed mood: Secondary | ICD-10-CM | POA: Diagnosis not present

## 2019-12-21 ENCOUNTER — Ambulatory Visit (INDEPENDENT_AMBULATORY_CARE_PROVIDER_SITE_OTHER): Payer: 59 | Admitting: Sports Medicine

## 2019-12-21 ENCOUNTER — Ambulatory Visit (INDEPENDENT_AMBULATORY_CARE_PROVIDER_SITE_OTHER): Payer: 59

## 2019-12-21 ENCOUNTER — Other Ambulatory Visit: Payer: Self-pay

## 2019-12-21 DIAGNOSIS — M65312 Trigger thumb, left thumb: Secondary | ICD-10-CM | POA: Diagnosis not present

## 2019-12-21 DIAGNOSIS — M79641 Pain in right hand: Secondary | ICD-10-CM

## 2019-12-21 DIAGNOSIS — M7541 Impingement syndrome of right shoulder: Secondary | ICD-10-CM | POA: Insufficient documentation

## 2019-12-21 DIAGNOSIS — M79601 Pain in right arm: Secondary | ICD-10-CM | POA: Insufficient documentation

## 2019-12-21 NOTE — Assessment & Plan Note (Signed)
Continues to do well 6 years after flexor tendon sheath injection.

## 2019-12-21 NOTE — Progress Notes (Signed)
    Procedures performed today:    Procedure: Real-time Ultrasound Guided injection of the right second flexor tendon sheath Device: Samsung HS60  Verbal informed consent obtained.  Time-out conducted.  Noted no overlying erythema, induration, or other signs of local infection.  Skin prepped in a sterile fashion.  Local anesthesia: Topical Ethyl chloride.  With sterile technique and under real time ultrasound guidance:  1/2 cc lidocaine, 1/2 cc Kenalog 40 injected easily completed without difficulty  Pain immediately resolved suggesting accurate placement of the medication.  Advised to call if fevers/chills, erythema, induration, drainage, or persistent bleeding.  Images permanently stored and available for review in the ultrasound unit.  Impression: Technically successful ultrasound guided injection.  Independent interpretation of notes and tests performed by another provider:   None.  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 She has loss of range of motion at the MCP, and tenderness volar, no obvious triggering. On exam she has tenderness at the volar second MCP over the flexor tendon, and she has about 10 to 20 degrees of flexion lag at the MCP compared to the contralateral side. This is all consistent with flexor tenosynovitis without a stenosing component. Today we treated her aggressively with an ultrasound-guided flexor tendon sheath injection, home rehab exercises, x-rays. Return to see me in 1 month.   If she has not regained her motion we will put her into formal physical therapy.  Trigger thumb of left hand Continues to do well 6 years after flexor tendon sheath injection.    ___________________________________________ Gwen Her. Dianah Field, M.D., ABFM., CAQSM. Primary Care and  [place] Instructor of [place] of [facility]

## 2019-12-21 NOTE — Assessment & Plan Note (Signed)
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 She has loss of range of motion at the MCP, and tenderness volar, no obvious triggering. On exam she has tenderness at the volar second MCP over the flexor tendon, and she has about 10 to 20 degrees of flexion lag at the MCP compared to the contralateral side. This is all consistent with flexor tenosynovitis without a stenosing component. Today we treated her aggressively with an ultrasound-guided flexor tendon sheath injection, home rehab exercises, x-rays. Return to see me in 1 month.   If she has not regained her motion we will put her into formal physical therapy.

## 2020-01-03 ENCOUNTER — Ambulatory Visit (INDEPENDENT_AMBULATORY_CARE_PROVIDER_SITE_OTHER): Payer: 59 | Admitting: Psychology

## 2020-01-03 DIAGNOSIS — F4321 Adjustment disorder with depressed mood: Secondary | ICD-10-CM | POA: Diagnosis not present

## 2020-01-17 ENCOUNTER — Ambulatory Visit (INDEPENDENT_AMBULATORY_CARE_PROVIDER_SITE_OTHER): Payer: 59 | Admitting: Psychology

## 2020-01-17 DIAGNOSIS — F4321 Adjustment disorder with depressed mood: Secondary | ICD-10-CM | POA: Diagnosis not present

## 2020-01-25 ENCOUNTER — Ambulatory Visit (INDEPENDENT_AMBULATORY_CARE_PROVIDER_SITE_OTHER): Payer: 59 | Admitting: Sports Medicine

## 2020-01-25 ENCOUNTER — Other Ambulatory Visit: Payer: Self-pay

## 2020-01-25 DIAGNOSIS — M79641 Pain in right hand: Secondary | ICD-10-CM

## 2020-01-25 NOTE — Assessment & Plan Note (Signed)
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 Return as needed.

## 2020-01-25 NOTE — Progress Notes (Signed)
    Procedures performed today:    None.  Independent interpretation of notes and tests performed by another provider:   None.  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 Return as needed.    ___________________________________________ Tanya [name]. Tanya [name], M.D., ABFM., CAQSM. Primary Care and [place] Instructor of [place] of [facility]

## 2020-02-14 ENCOUNTER — Ambulatory Visit (INDEPENDENT_AMBULATORY_CARE_PROVIDER_SITE_OTHER): Payer: 59 | Admitting: Psychology

## 2020-02-14 DIAGNOSIS — F4321 Adjustment disorder with depressed mood: Secondary | ICD-10-CM | POA: Diagnosis not present

## 2020-03-21 ENCOUNTER — Ambulatory Visit: Payer: 59 | Admitting: Psychology

## 2020-04-05 ENCOUNTER — Ambulatory Visit (INDEPENDENT_AMBULATORY_CARE_PROVIDER_SITE_OTHER): Payer: 59 | Admitting: Psychology

## 2020-04-05 DIAGNOSIS — F4321 Adjustment disorder with depressed mood: Secondary | ICD-10-CM | POA: Diagnosis not present

## 2020-05-17 ENCOUNTER — Ambulatory Visit (INDEPENDENT_AMBULATORY_CARE_PROVIDER_SITE_OTHER): Payer: 59 | Admitting: Psychology

## 2020-05-17 DIAGNOSIS — F4321 Adjustment disorder with depressed mood: Secondary | ICD-10-CM

## 2020-07-16 DIAGNOSIS — H31093 Other chorioretinal scars, bilateral: Secondary | ICD-10-CM | POA: Diagnosis not present

## 2020-07-16 DIAGNOSIS — H5203 Hypermetropia, bilateral: Secondary | ICD-10-CM | POA: Diagnosis not present

## 2020-08-03 DIAGNOSIS — Z1231 Encounter for screening mammogram for malignant neoplasm of breast: Secondary | ICD-10-CM | POA: Diagnosis not present

## 2020-08-03 LAB — HM MAMMOGRAPHY

## 2020-09-26 ENCOUNTER — Ambulatory Visit (INDEPENDENT_AMBULATORY_CARE_PROVIDER_SITE_OTHER): Payer: 59

## 2020-09-26 ENCOUNTER — Other Ambulatory Visit: Payer: Self-pay

## 2020-09-26 ENCOUNTER — Ambulatory Visit: Payer: 59 | Admitting: Sports Medicine

## 2020-09-26 DIAGNOSIS — M65311 Trigger thumb, right thumb: Secondary | ICD-10-CM | POA: Diagnosis not present

## 2020-09-26 NOTE — Progress Notes (Signed)
    Procedures performed today:    Procedure: Real-time Ultrasound Guided injection of the right flexor pollicis longus tendon sheath Device: Samsung HS60  Verbal informed consent obtained.  Time-out conducted.  Noted no overlying erythema, induration, or other signs of local infection.  Skin prepped in a sterile fashion.  Local anesthesia: Topical Ethyl chloride.  With sterile technique and under real time ultrasound guidance:  Noted trace tenosynovitis, 1/2 cc lidocaine, 1/2 cc kenalog 40 injected easily. Completed without difficulty  Advised to call if fevers/chills, erythema, induration, drainage, or persistent bleeding.  Images permanently stored and available for review in PACS.  Impression: Technically successful ultrasound guided injection.  Independent interpretation of notes and tests performed by another provider:   None.  Brief History, Exam, Impression, and Recommendations:    Trigger thumb, right thumb Minor trauma resulting in right thumb flexor tenosynovitis with triggering, flexor pollicis longus tendon sheath injection as above. Trigger thumb rehab exercises given. Return to see me in a month.    ___________________________________________ Gwen Her. Dianah Field, M.D., ABFM., CAQSM. Primary Care and [place] Instructor of [place] of [facility] of [facility]

## 2020-09-26 NOTE — Assessment & Plan Note (Addendum)
Minor trauma resulting in right thumb flexor tenosynovitis with triggering, flexor pollicis longus tendon sheath injection as above. Trigger thumb rehab exercises given. Return to see me in a month.

## 2020-11-01 ENCOUNTER — Other Ambulatory Visit: Payer: Self-pay

## 2020-11-01 ENCOUNTER — Emergency Department
Admission: EM | Admit: 2020-11-01 | Discharge: 2020-11-01 | Disposition: A | Discharge status: Home / Self Care | Payer: 59 | Source: Home / Self Care | Attending: Emergency Medicine | Admitting: Emergency Medicine

## 2020-11-01 ENCOUNTER — Encounter: Payer: Self-pay | Admitting: Emergency Medicine

## 2020-11-01 DIAGNOSIS — R5383 Other fatigue: Secondary | ICD-10-CM | POA: Diagnosis not present

## 2020-11-01 DIAGNOSIS — M542 Cervicalgia: Secondary | ICD-10-CM

## 2020-11-01 MED ORDER — NAPROXEN 500 MG PO TABS: 500.0000 mg | ORAL_TABLET | Freq: Two times a day (BID) | ORAL | 0 refills | Status: DC

## 2020-11-01 NOTE — ED Triage Notes (Signed)
Fell on Monday at work - per pt slipped in standing water  Pain to left side anterior neck Cleared by occupational health today  Left work today due to not feeling good Sent up to Urgent Care for COVID test  & further evaluation Pt tearful in triage due to s/o recently finishing chemo & radiation & doesn't want him to get sick COVID vaccine + booster

## 2020-11-01 NOTE — Discharge Instructions (Addendum)
Your Covid and flu test will be back in several days.  Try the Naprosyn as prescribed.  Follow-up with your doctor in several days if your neck pain has not resolved.

## 2020-11-01 NOTE — ED Provider Notes (Addendum)
HPI  SUBJECTIVE:  Tanya [name] is a 50 y.o. female who reports of left anterior neck pain that is intermittent, lasting seconds, described as soreness.  She states it feels like if it is on the "inside" of her neck.  She fell and slipped at work 4 days ago.  She did not hit her head.  She does not not remember if she twisted her neck.  She denies having neck pain after the fall.  She has been evaluated and cleared by occupational health.  She was sent to the urgent care for further evaluation because she has "not felt well" with intermittent malaise and body aches for the past several days.  She denies fevers, sore throat, nasal congestion or rhinorrhea, postnasal drip, voice changes, drooling, trismus, neck stiffness, sensation of throat swelling shut, coughing, wheezing, shortness of breath.  No loss of smell or taste, no nausea, vomiting, diarrhea.  No dental pain, ear pain, direct trauma to the neck.  She has tried Aleve which sometimes helps.  Symptoms are worse with turning her head to the left.  She took Aleve this morning.  No antibiotics in the past month.  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  She has had the COVID booster and also flu vaccine.  She is concerned that she may get her husband sick as he has just finished chemotherapy and radiation.  She has a past medical history of syncope, seasonal allergies.  No history of coronary disease, hypercholesterolemia, carotid artery stenosis, stroke, diabetes, hypertension.  PMD: [facility].    Past Medical History:  Diagnosis Date  . GERD (gastroesophageal reflux disease)   . Syncope and collapse 09/09/2016    Past Surgical History:  Procedure Laterality Date  . FINGER SURGERY    . TENDON REPAIR     RT 4th digit  . tri mallear      Family History  Problem Relation Age of Onset  . Cancer Father        prostate  . Heart attack Maternal Grandmother   . Hypertension Maternal Grandmother   .  Parkinson's disease Maternal Grandfather     Social History   Tobacco Use  . Smoking status: Never Smoker  . Smokeless tobacco: Never Used  Vaping Use  . Vaping Use: Never used  Substance Use Topics  . Alcohol use: Not Currently  . Drug use: No    No current facility-administered medications for this encounter.  Current Outpatient Medications:  .  Cyanocobalamin (VITAMIN B-12) 1000 MCG/15ML LIQD, Take 1 mL by mouth., Disp: , Rfl:  .  magnesium gluconate (MAGONATE) 500 MG tablet, Take 250 mg by mouth daily at 2 PM., Disp: , Rfl:  .  naproxen (NAPROSYN) 500 MG tablet, Take 1 tablet (500 mg total) by mouth 2 (two) times daily., Disp: 20 tablet, Rfl: 0 .  omeprazole (PRILOSEC OTC) 20 MG tablet, Take 20 mg daily by mouth., Disp: , Rfl:  .  ondansetron (ZOFRAN-ODT) 4 MG disintegrating tablet, Take 1 tablet (4 mg total) by mouth every 8 (eight) hours as needed for nausea or vomiting., Disp: 30 tablet, Rfl: 2 .  APPLE CIDER VINEGAR PO, Take 1 tablet by mouth as needed. gummies - for heart burn, Disp: , Rfl:  .  calcium carbonate (TUMS EX) 750 MG chewable tablet, Chew 1 tablet by mouth as needed. Takes 3-4 tablets, Disp: , Rfl:  .  meclizine (ANTIVERT) 32 MG tablet, Take 1 tablet (32 mg  total) by mouth 3 (three) times daily as needed., Disp: 30 tablet, Rfl: 2  Facility-Administered Medications Ordered in Other Encounters:  .  gadopentetate dimeglumine (MAGNEVIST) injection 17 mL, 17 mL, Intravenous, Once PRN, Kathrynn Ducking, MD  Allergies  Allergen Reactions  . Other Other (See Comments)    Cats per pt      ROS  As noted in HPI.   Physical Exam  BP (!) 157/92 (BP Location: Right Arm)   Pulse 72   Temp 98.9 F (37.2 C) (Oral)   Resp 17   SpO2 98%   Constitutional: Well developed, well nourished, no acute distress Eyes: PERRL, EOMI, conjunctiva normal bilaterally.  HENT: Normocephalic, atraumatic,mucus membranes moist, normal dentition.   No nasal congestion, no sinus  tenderness.  Oropharynx normal.  Tonsils normal without exudates. Neck: no anterior, posterior, cervical lymphadenopathy.  No submental LN.  No tenderness over the sternocleidomastoid.  No trapezial muscle tenderness. No meningismus.   Respiratory: normal inspiratory effort, lungs clear bilaterally Cardiovascular: Normal rate, regular rhythm.  No carotid bruit. GI:  nondistended skin: No rash, skin intact Musculoskeletal: No C-spine tenderness. Neurologic: Alert & oriented x 3, CN III-XII grossly intact.  Coordination normal, moving all extremities equally. Psychiatric: Speech and behavior appropriate   ED Course   Medications - No data to display  Orders Placed This Encounter  Procedures  . COVID-19, Flu A+B ([facility])    Standing Status:   Standing    Number of Occurrences:   1   No results found for this or any previous visit (from the past 24 hour(s)). No results found.   ED Clinical Impression  1. Neck pain   2. Fatigue, unspecified type     ED Assessment/Plan  Patient has no C-spine tenderness.  It would be very atypical presentation of a C-spine fracture to have intermittent, seconds long anterior left neck pain.  She has no direct trauma to the neck.  Thus imaging C-spine was not done.  She does not appear to have any muscular tenderness.  Cannot appreciate any lymphadenopathy.  She does not have any evidence of dental infection.  Considered carotid artery stenosis, jugular vein thrombophlebitis, but she has no CAD risk factors/history of hypercholesterolemia, she has no carotid bruit, she has no strokelike symptoms. she has no reason to have jugular vein thrombophlebitis.  She has no neck stiffness.  Will check for flu and COVID given husband's current immunocompromised state and concern that patient is "coming down with something".  Will prescribe Naprosyn twice a day since she states this works well for her.  She will follow-up with her PMD if symptoms persist and they can  consider an ultrasound at that time.  Do not think that she needs an emergent ultrasound today.  Discussed labs, medical decision-making, plan for follow-up with patient.  She agrees with plan.  Addendum 11/03/20 1113  Labs reviewed. COVID, influenza A and B negative  Meds ordered this encounter  Medications  . naproxen (NAPROSYN) 500 MG tablet    Sig: Take 1 tablet (500 mg total) by mouth 2 (two) times daily.    Dispense:  20 tablet    Refill:  0    *This clinic note was created using [employer]. Therefore, there may be occasional mistakes despite careful proofreading.  ?    Melynda Ripple, MD 11/02/20 0272    Melynda Ripple, MD 11/03/20 1113

## 2020-11-02 LAB — COVID-19, FLU A+B NAA
Influenza A, NAA: NOT DETECTED
Influenza B, NAA: NOT DETECTED
SARS-CoV-2, NAA: NOT DETECTED

## 2020-12-31 DIAGNOSIS — D164 Benign neoplasm of bones of skull and face: Secondary | ICD-10-CM | POA: Diagnosis not present

## 2021-02-15 DIAGNOSIS — D164 Benign neoplasm of bones of skull and face: Secondary | ICD-10-CM | POA: Diagnosis not present

## 2021-02-15 DIAGNOSIS — D2339 Other benign neoplasm of skin of other parts of face: Secondary | ICD-10-CM | POA: Diagnosis not present

## 2021-03-27 ENCOUNTER — Encounter: Payer: Self-pay | Admitting: Family Medicine

## 2021-05-08 DIAGNOSIS — M9903 Segmental and somatic dysfunction of lumbar region: Secondary | ICD-10-CM | POA: Diagnosis not present

## 2021-05-08 DIAGNOSIS — M9901 Segmental and somatic dysfunction of cervical region: Secondary | ICD-10-CM | POA: Diagnosis not present

## 2021-05-08 DIAGNOSIS — M5032 Other cervical disc degeneration, mid-cervical region, unspecified level: Secondary | ICD-10-CM | POA: Diagnosis not present

## 2021-05-08 DIAGNOSIS — M25511 Pain in right shoulder: Secondary | ICD-10-CM | POA: Diagnosis not present

## 2021-05-08 DIAGNOSIS — M545 Low back pain, unspecified: Secondary | ICD-10-CM | POA: Diagnosis not present

## 2021-05-08 DIAGNOSIS — M9904 Segmental and somatic dysfunction of sacral region: Secondary | ICD-10-CM | POA: Diagnosis not present

## 2021-05-08 DIAGNOSIS — M47816 Spondylosis without myelopathy or radiculopathy, lumbar region: Secondary | ICD-10-CM | POA: Diagnosis not present

## 2021-05-08 DIAGNOSIS — M9902 Segmental and somatic dysfunction of thoracic region: Secondary | ICD-10-CM | POA: Diagnosis not present

## 2021-05-08 DIAGNOSIS — M9905 Segmental and somatic dysfunction of pelvic region: Secondary | ICD-10-CM | POA: Diagnosis not present

## 2021-05-27 ENCOUNTER — Other Ambulatory Visit: Payer: Self-pay

## 2021-05-27 ENCOUNTER — Ambulatory Visit: Payer: 59 | Admitting: Physician Assistant

## 2021-05-27 VITALS — BP 160/69 | HR 65 | Ht 63.0 in | Wt 209.0 lb

## 2021-05-27 DIAGNOSIS — Z1329 Encounter for screening for other suspected endocrine disorder: Secondary | ICD-10-CM | POA: Diagnosis not present

## 2021-05-27 DIAGNOSIS — Z131 Encounter for screening for diabetes mellitus: Secondary | ICD-10-CM

## 2021-05-27 DIAGNOSIS — Z1159 Encounter for screening for other viral diseases: Secondary | ICD-10-CM | POA: Diagnosis not present

## 2021-05-27 DIAGNOSIS — E785 Hyperlipidemia, unspecified: Secondary | ICD-10-CM | POA: Diagnosis not present

## 2021-05-27 DIAGNOSIS — Z13220 Encounter for screening for lipoid disorders: Secondary | ICD-10-CM | POA: Diagnosis not present

## 2021-05-27 DIAGNOSIS — R5383 Other fatigue: Secondary | ICD-10-CM | POA: Diagnosis not present

## 2021-05-27 DIAGNOSIS — Z0000 Encounter for general adult medical examination without abnormal findings: Secondary | ICD-10-CM | POA: Diagnosis not present

## 2021-05-27 DIAGNOSIS — R030 Elevated blood-pressure reading, without diagnosis of hypertension: Secondary | ICD-10-CM

## 2021-05-27 DIAGNOSIS — Z780 Asymptomatic menopausal state: Secondary | ICD-10-CM | POA: Diagnosis not present

## 2021-05-27 DIAGNOSIS — Z1211 Encounter for screening for malignant neoplasm of colon: Secondary | ICD-10-CM | POA: Diagnosis not present

## 2021-05-27 NOTE — Progress Notes (Signed)
Subjective:     GENELLE ECONOMOU is a 50 y.o. female and is here for a comprehensive physical exam. The patient reports no problems.  Social History   Socioeconomic History   Marital status: Married    Spouse name: Legrand Como   Number of children: 0   Years of education: BA   Highest education level: Not on file  Occupational History   Occupation: Government social research officer and [employer]  Tobacco Use   Smoking status: Never   Smokeless tobacco: Never  Vaping Use   Vaping Use: Never used  Substance and Sexual Activity   Alcohol use: Not Currently   Drug use: No   Sexual activity: Yes    Birth control/protection: Post-menopausal  Other Topics Concern   Not on file  Social History Narrative   Lives with husband   Caffeine use:     Social Determinants of Health   Financial Resource Strain: Not on file  Food Insecurity: Not on file  Transportation Needs: Not on file  Physical Activity: Not on file  Stress: Not on file  Social Connections: Not on file  Intimate Partner Violence: Not on file   Health Maintenance  Topic Date Due   Fecal DNA (Cologuard)  Never done   PAP SMEAR-Modifier  09/02/2020   COVID-19 Vaccine (4 - Booster for [place] series) 06/12/2021 (Originally 10/28/2020)   Zoster Vaccines- Shingrix (1 of 2) 08/27/2021 (Originally 10/06/2018)   HIV Screening  07/03/2026 (Originally 10/06/1985)   MAMMOGRAM  08/03/2021   TETANUS/TDAP  11/29/2026   INFLUENZA VACCINE  Completed   Hepatitis C Screening  Completed   Pneumococcal Vaccine 49-29 Years old  Aged Out   HPV VACCINES  Aged Out    The following portions of the patient's history were reviewed and updated as appropriate: allergies, current medications, past family history, past medical history, past social history, past surgical history, and problem list.  Review of Systems A comprehensive review of systems was negative.   Objective:    BP (!) 160/69   Pulse 65   Ht 5\' 3"  (1.6 m)   Wt 209 lb (94.8 kg)    SpO2 98%   BMI 37.02 kg/m  General appearance: alert, cooperative, appears stated age, and moderately obese Head: Normocephalic, without obvious abnormality, atraumatic Eyes: conjunctivae/corneas clear. PERRL, EOM's intact. Fundi benign. Ears: normal TM's and external ear canals both ears Nose: Nares normal. Septum midline. Mucosa normal. No drainage or sinus tenderness. Throat: lips, mucosa, and tongue normal; teeth and gums normal Neck: no adenopathy, no carotid bruit, no JVD, supple, symmetrical, trachea midline, and thyroid not enlarged, symmetric, no tenderness/mass/nodules Back: symmetric, no curvature. ROM normal. No CVA tenderness. Lungs: clear to auscultation bilaterally Heart: regular rate and rhythm, S1, S2 normal, no murmur, click, rub or gallop Abdomen: soft, non-tender; bowel sounds normal; no masses,  no organomegaly Extremities: extremities normal, atraumatic, no cyanosis or edema Pulses: 2+ and symmetric Skin: Skin color, texture, turgor normal. No rashes or lesions Lymph nodes: Cervical, supraclavicular, and axillary nodes normal. Neurologic: Grossly normal   .. Depression screen [facility] 2/9 12/12/2019 09/02/2017 06/05/2017 11/28/2016 10/17/2016  Decreased Interest 0 0 0 0 0  Down, Depressed, Hopeless 0 0 0 0 0  PHQ - 2 Score 0 0 0 0 0  Altered sleeping 0 - - 0 -  Tired, decreased energy 0 - - 0 -  Change in appetite 0 - - 0 -  Feeling bad or failure about yourself  0 - - 0 -  Trouble concentrating 0 - - 0 -  Moving slowly or fidgety/restless 0 - - - -  Suicidal thoughts 0 - - 0 -  PHQ-9 Score 0 - - 0 -  Difficult doing work/chores Not difficult at all - - - -    Assessment:    Healthy female exam.      Plan:  Marland KitchenMarland KitchenAlesia was seen today for follow-up.  Diagnoses and all orders for this visit:  Annual physical exam -     TSH -     Lipid Panel w/reflex Direct LDL -     COMPLETE METABOLIC PANEL WITH GFR -     CBC with Differential/Platelet -     Hepatitis C  Antibody  Dyslipidemia (high LDL; low HDL) -     Lipid Panel w/reflex Direct LDL  Diabetes mellitus screening -     COMPLETE METABOLIC PANEL WITH GFR -     Hemoglobin A1C w/out eAG  Lipid screening -     Lipid Panel w/reflex Direct LDL  Thyroid disorder screen -     TSH  Colon cancer screening -     Cologuard  No energy -     Iron, TIBC and Ferritin Panel -     TEST AUTHORIZATION  Encounter for hepatitis C screening test for low risk patient -     Hepatitis C Antibody  Post-menopausal -     FSH/LH  Elevated blood pressure reading  .Marland Kitchen Discussed 150 minutes of exercise a week.  Encouraged vitamin D 1000 units and Calcium 1300mg  or 4 servings of dairy a day.  Fasting labs ordered.  BP elevated. Discussed DASH diet and weight loss. Start checking at home. Recheck 1 month.  PHQ no concerns.  Mammogram UtD.  Needs pap smear.  Cologuard ordered.  Covid vaccine x3.  Flu shot UTD.  Declined shingles vaccine.      See After Visit Summary for Counseling Recommendations

## 2021-05-27 NOTE — Patient Instructions (Addendum)
DASH Eating Plan DASH stands for Dietary Approaches to Stop Hypertension. The DASH eating plan is a healthy eating plan that has been shown to: Reduce high blood pressure (hypertension). Reduce your risk for type 2 diabetes, heart disease, and stroke. Help with weight loss. What are tips for following this plan? Reading food labels Check food labels for the amount of salt (sodium) per serving. Choose foods with less than 5 percent of the Daily Value of sodium. Generally, foods with less than 300 milligrams (mg) of sodium per serving fit into this eating plan. To find whole grains, look for the word "whole" as the first word in the ingredient list. Shopping Buy products labeled as "low-sodium" or "no salt added." Buy fresh foods. Avoid canned foods and pre-made or frozen meals. Cooking Avoid adding salt when cooking. Use salt-free seasonings or herbs instead of table salt or sea salt. Check with your health care provider or pharmacist before using salt substitutes. Do not fry foods. Cook foods using healthy methods such as baking, boiling, grilling, roasting, and broiling instead. Cook with heart-healthy oils, such as olive, canola, avocado, soybean, or sunflower oil. Meal planning  Eat a balanced diet that includes: 4 or more servings of fruits and 4 or more servings of vegetables each day. Try to fill one-half of your plate with fruits and vegetables. 6-8 servings of whole grains each day. Less than 6 oz (170 g) of lean meat, poultry, or fish each day. A 3-oz (85-g) serving of meat is about the same size as a deck of cards. One egg equals 1 oz (28 g). 2-3 servings of low-fat dairy each day. One serving is 1 cup (237 mL). 1 serving of nuts, seeds, or beans 5 times each week. 2-3 servings of heart-healthy fats. Healthy fats called omega-3 fatty acids are found in foods such as walnuts, flaxseeds, fortified milks, and eggs. These fats are also found in cold-water fish, such as sardines, salmon,  and mackerel. Limit how much you eat of: Canned or prepackaged foods. Food that is high in trans fat, such as some fried foods. Food that is high in saturated fat, such as fatty meat. Desserts and other sweets, sugary drinks, and other foods with added sugar. Full-fat dairy products. Do not salt foods before eating. Do not eat more than 4 egg yolks a week. Try to eat at least 2 vegetarian meals a week. Eat more home-cooked food and less restaurant, buffet, and fast food. Lifestyle When eating at a restaurant, ask that your food be prepared with less salt or no salt, if possible. If you drink alcohol: Limit how much you use to: 0-1 drink a day for women who are not pregnant. 0-2 drinks a day for men. Be aware of how much alcohol is in your drink. In the [place] one drink equals one 12 oz bottle of beer (355 mL), one 5 oz glass of wine (148 mL), or one 1 oz glass of hard liquor (44 mL). General information Avoid eating more than 2,300 mg of salt a day. If you have hypertension, you may need to reduce your sodium intake to 1,500 mg a day. Work with your health care provider to maintain a healthy body weight or to lose weight. Ask what an ideal weight is for you. Get at least 30 minutes of exercise that causes your heart to beat faster (aerobic exercise) most days of the week. Activities may include walking, swimming, or biking. Work with your health care provider or dietitian to   adjust your eating plan to your individual calorie needs. What foods should I eat? Fruits All fresh, dried, or frozen fruit. Canned fruit in natural juice (without added sugar). Vegetables Fresh or frozen vegetables (raw, steamed, roasted, or grilled). Low-sodium or reduced-sodium tomato and vegetable juice. Low-sodium or reduced-sodium tomato sauce and tomato paste. Low-sodium or reduced-sodium canned vegetables. Grains Whole-grain or whole-wheat bread. Whole-grain or whole-wheat pasta. Brown rice. Oatmeal. Quinoa.  Bulgur. Whole-grain and low-sodium cereals. Pita bread. Low-fat, low-sodium crackers. Whole-wheat flour tortillas. Meats and other proteins Skinless chicken or turkey. Ground chicken or turkey. Pork with fat trimmed off. Fish and seafood. Egg whites. Dried beans, peas, or lentils. Unsalted nuts, nut butters, and seeds. Unsalted canned beans. Lean cuts of beef with fat trimmed off. Low-sodium, lean precooked or cured meat, such as sausages or meat loaves. Dairy Low-fat (1%) or fat-free (skim) milk. Reduced-fat, low-fat, or fat-free cheeses. Nonfat, low-sodium ricotta or cottage cheese. Low-fat or nonfat yogurt. Low-fat, low-sodium cheese. Fats and oils Soft margarine without trans fats. Vegetable oil. Reduced-fat, low-fat, or light mayonnaise and salad dressings (reduced-sodium). Canola, safflower, olive, avocado, soybean, and sunflower oils. Avocado. Seasonings and condiments Herbs. Spices. Seasoning mixes without salt. Other foods Unsalted popcorn and pretzels. Fat-free sweets. The items listed above may not be a complete list of foods and beverages you can eat. Contact a dietitian for more information. What foods should I avoid? Fruits Canned fruit in a light or heavy syrup. Fried fruit. Fruit in cream or butter sauce. Vegetables Creamed or fried vegetables. Vegetables in a cheese sauce. Regular canned vegetables (not low-sodium or reduced-sodium). Regular canned tomato sauce and paste (not low-sodium or reduced-sodium). Regular tomato and vegetable juice (not low-sodium or reduced-sodium). Pickles. Olives. Grains Baked goods made with fat, such as croissants, muffins, or some breads. Dry pasta or rice meal packs. Meats and other proteins Fatty cuts of meat. Ribs. Fried meat. Bacon. Bologna, salami, and other precooked or cured meats, such as sausages or meat loaves. Fat from the back of a pig (fatback). Bratwurst. Salted nuts and seeds. Canned beans with added salt. Canned or smoked fish.  Whole eggs or egg yolks. Chicken or turkey with skin. Dairy Whole or 2% milk, cream, and half-and-half. Whole or full-fat cream cheese. Whole-fat or sweetened yogurt. Full-fat cheese. Nondairy creamers. Whipped toppings. Processed cheese and cheese spreads. Fats and oils Butter. Stick margarine. Lard. Shortening. Ghee. Bacon fat. Tropical oils, such as coconut, palm kernel, or palm oil. Seasonings and condiments Onion salt, garlic salt, seasoned salt, table salt, and sea salt. Worcestershire sauce. Tartar sauce. Barbecue sauce. Teriyaki sauce. Soy sauce, including reduced-sodium. Steak sauce. Canned and packaged gravies. Fish sauce. Oyster sauce. Cocktail sauce. Store-bought horseradish. Ketchup. Mustard. Meat flavorings and tenderizers. Bouillon cubes. Hot sauces. Pre-made or packaged marinades. Pre-made or packaged taco seasonings. Relishes. Regular salad dressings. Other foods Salted popcorn and pretzels. The items listed above may not be a complete list of foods and beverages you should avoid. Contact a dietitian for more information. Where to find more information National Heart, Lung, and Blood Institute: www.nhlbi.nih.gov American Heart Association: www.heart.org Academy of Nutrition and Dietetics: www.eatright.org National Kidney Foundation: www.kidney.org Summary The DASH eating plan is a healthy eating plan that has been shown to reduce high blood pressure (hypertension). It may also reduce your risk for type 2 diabetes, heart disease, and stroke. When on the DASH eating plan, aim to eat more fresh fruits and vegetables, whole grains, lean proteins, low-fat dairy, and heart-healthy fats. With the DASH   eating plan, you should limit salt (sodium) intake to 2,300 mg a day. If you have hypertension, you may need to reduce your sodium intake to 1,500 mg a day. Work with your health care provider or dietitian to adjust your eating plan to your individual calorie needs. This information is not  intended to replace advice given to you by your health care provider. Make sure you discuss any questions you have with your health care provider. Document Revised: 06/17/2019 Document Reviewed: 06/17/2019 Elsevier Patient Education  2022 [place] Maintenance, Female Adopting a healthy lifestyle and getting preventive care are important in promoting health and wellness. Ask your health care provider about: The right schedule for you to have regular tests and exams. Things you can do on your own to prevent diseases and keep yourself healthy. What should I know about diet, weight, and exercise? Eat a healthy diet  Eat a diet that includes plenty of vegetables, fruits, low-fat dairy products, and lean protein. Do not eat a lot of foods that are high in solid fats, added sugars, or sodium. Maintain a healthy weight Body mass index (BMI) is used to identify weight problems. It estimates body fat based on height and weight. Your health care provider can help determine your BMI and help you achieve or maintain a healthy weight. Get regular exercise Get regular exercise. This is one of the most important things you can do for your health. Most adults should: Exercise for at least 150 minutes each week. The exercise should increase your heart rate and make you sweat (moderate-intensity exercise). Do strengthening exercises at least twice a week. This is in addition to the moderate-intensity exercise. Spend less time sitting. Even light physical activity can be beneficial. Watch cholesterol and blood lipids Have your blood tested for lipids and cholesterol at 52 years of age, then have this test every 5 years. Have your cholesterol levels checked more often if: Your lipid or cholesterol levels are high. You are older than 52 years of age. You are at high risk for heart disease. What should I know about cancer screening? Depending on your health history and family history, you may  need to have cancer screening at various ages. This may include screening for: Breast cancer. Cervical cancer. Colorectal cancer. Skin cancer. Lung cancer. What should I know about heart disease, diabetes, and high blood pressure? Blood pressure and heart disease High blood pressure causes heart disease and increases the risk of stroke. This is more likely to develop in people who have high blood pressure readings, are of African descent, or are overweight. Have your blood pressure checked: Every 3-5 years if you are 84-81 years of age. Every year if you are 56 years old or older. Diabetes Have regular diabetes screenings. This checks your fasting blood sugar level. Have the screening done: Once every three years after age 42 if you are at a normal weight and have a low risk for diabetes. More often and at a younger age if you are overweight or have a high risk for diabetes. What should I know about preventing infection? Hepatitis B If you have a higher risk for hepatitis B, you should be screened for this virus. Talk with your health care provider to find out if you are at risk for hepatitis B infection. Hepatitis C Testing is recommended for: Everyone born from [birth year] through 1967. Anyone with known risk factors for hepatitis C. Sexually transmitted infections (STIs) Get screened for STIs, including gonorrhea  and chlamydia, if: You are sexually active and are younger than 52 years of age. You are older than 52 years of age and your health care provider tells you that you are at risk for this type of infection. Your sexual activity has changed since you were last screened, and you are at increased risk for chlamydia or gonorrhea. Ask your health care provider if you are at risk. Ask your health care provider about whether you are at high risk for HIV. Your health care provider may recommend a prescription medicine to help prevent HIV infection. If you choose to take medicine to prevent  HIV, you should first get tested for HIV. You should then be tested every 3 months for as long as you are taking the medicine. Pregnancy If you are about to stop having your period (premenopausal) and you may become pregnant, seek counseling before you get pregnant. Take 400 to 800 micrograms (mcg) of folic acid every day if you become pregnant. Ask for birth control (contraception) if you want to prevent pregnancy. Osteoporosis and menopause Osteoporosis is a disease in which the bones lose minerals and strength with aging. This can result in bone fractures. If you are 47 years old or older, or if you are at risk for osteoporosis and fractures, ask your health care provider if you should: Be screened for bone loss. Take a calcium or vitamin D supplement to lower your risk of fractures. Be given hormone replacement therapy (HRT) to treat symptoms of menopause. Follow these instructions at home: Lifestyle Do not use any products that contain nicotine or tobacco, such as cigarettes, e-cigarettes, and chewing tobacco. If you need help quitting, ask your health care provider. Do not use street drugs. Do not share needles. Ask your health care provider for help if you need support or information about quitting drugs. Alcohol use Do not drink alcohol if: Your health care provider tells you not to drink. You are pregnant, may be pregnant, or are planning to become pregnant. If you drink alcohol: Limit how much you use to 0-1 drink a day. Limit intake if you are breastfeeding. Be aware of how much alcohol is in your drink. In the [place] one drink equals one 12 oz bottle of beer (355 mL), one 5 oz glass of wine (148 mL), or one 1 oz glass of hard liquor (44 mL). General instructions Schedule regular health, dental, and eye exams. Stay current with your vaccines. Tell your health care provider if: You often feel depressed. You have ever been abused or do not feel safe at home. Summary Adopting a  healthy lifestyle and getting preventive care are important in promoting health and wellness. Follow your health care provider's instructions about healthy diet, exercising, and getting tested or screened for diseases. Follow your health care provider's instructions on monitoring your cholesterol and blood pressure. This information is not intended to replace advice given to you by your health care provider. Make sure you discuss any questions you have with your health care provider. Document Revised: 09/21/2020 Document Reviewed: 07/07/2018 Elsevier Patient Education  2022 [employer].

## 2021-05-28 ENCOUNTER — Encounter: Payer: Self-pay | Admitting: Physician Assistant

## 2021-05-28 DIAGNOSIS — D509 Iron deficiency anemia, unspecified: Secondary | ICD-10-CM | POA: Insufficient documentation

## 2021-05-28 LAB — CBC WITH DIFFERENTIAL/PLATELET
Absolute Monocytes: 289 cells/uL (ref 200–950)
Basophils Absolute: 49 cells/uL (ref 0–200)
Basophils Relative: 1 %
Eosinophils Absolute: 211 cells/uL (ref 15–500)
Eosinophils Relative: 4.3 %
HCT: 36.1 % (ref 35.0–45.0)
Hemoglobin: 11.3 g/dL — ABNORMAL LOW (ref 11.7–15.5)
Lymphs Abs: 1191 cells/uL (ref 850–3900)
MCH: 23.8 pg — ABNORMAL LOW (ref 27.0–33.0)
MCHC: 31.3 g/dL — ABNORMAL LOW (ref 32.0–36.0)
MCV: 76.2 fL — ABNORMAL LOW (ref 80.0–100.0)
MPV: 13 fL — ABNORMAL HIGH (ref 7.5–12.5)
Monocytes Relative: 5.9 %
Neutro Abs: 3161 cells/uL (ref 1500–7800)
Neutrophils Relative %: 64.5 %
Platelets: 202 10*3/uL (ref 140–400)
RBC: 4.74 10*6/uL (ref 3.80–5.10)
RDW: 15 % (ref 11.0–15.0)
Total Lymphocyte: 24.3 %
WBC: 4.9 10*3/uL (ref 3.8–10.8)

## 2021-05-28 LAB — COMPLETE METABOLIC PANEL WITH GFR
AG Ratio: 1.8 (calc) (ref 1.0–2.5)
ALT: 8 U/L (ref 6–29)
AST: 18 U/L (ref 10–35)
Albumin: 4.2 g/dL (ref 3.6–5.1)
Alkaline phosphatase (APISO): 81 U/L (ref 37–153)
BUN: 18 mg/dL (ref 7–25)
CO2: 28 mmol/L (ref 20–32)
Calcium: 9.3 mg/dL (ref 8.6–10.4)
Chloride: 105 mmol/L (ref 98–110)
Creat: 0.98 mg/dL (ref 0.50–1.03)
Globulin: 2.4 g/dL (calc) (ref 1.9–3.7)
Glucose, Bld: 107 mg/dL — ABNORMAL HIGH (ref 65–99)
Potassium: 4.8 mmol/L (ref 3.5–5.3)
Sodium: 143 mmol/L (ref 135–146)
Total Bilirubin: 0.4 mg/dL (ref 0.2–1.2)
Total Protein: 6.6 g/dL (ref 6.1–8.1)
eGFR: 69 mL/min/{1.73_m2} (ref >=60)

## 2021-05-28 LAB — LIPID PANEL W/REFLEX DIRECT LDL
Cholesterol: 204 mg/dL — ABNORMAL HIGH (ref <200)
HDL: 46 mg/dL — ABNORMAL LOW (ref >=50)
LDL Cholesterol (Calc): 136 mg/dL (calc) — ABNORMAL HIGH
Non-HDL Cholesterol (Calc): 158 mg/dL (calc) — ABNORMAL HIGH (ref <130)
Total CHOL/HDL Ratio: 4.4 (calc) (ref <5.0)
Triglycerides: 117 mg/dL (ref <150)

## 2021-05-28 LAB — TSH: TSH: 1.91 mIU/L

## 2021-05-28 LAB — HEPATITIS C ANTIBODY
Hepatitis C Ab: NONREACTIVE
SIGNAL TO CUT-OFF: 0.04 (ref <1.00)

## 2021-05-28 NOTE — Progress Notes (Signed)
Will need to come in and have A1C drawn.

## 2021-05-28 NOTE — Progress Notes (Signed)
Tanya [name],   Thyroid looks great.  Fasting glucose is elevated. Needs to get A1C to better look at diabetes.  In menopause based on [facility] and LH.  Kidney and liver look great.  HDL a little lower than 1 year ago but LDL, bad cholesterol, much better than 1 year ago. Overall 10 year CV risk is 2.9 percent and pretty low right now.  Hemoglobin is low. Please add ferritin and serum iron. I would recommend iron supplement 325mg  with breakfast. I would also recommend a regular colonoscopy to look for any sources of blood loss.  WBC look great.

## 2021-05-29 ENCOUNTER — Other Ambulatory Visit (HOSPITAL_COMMUNITY): Payer: Self-pay

## 2021-05-29 ENCOUNTER — Telehealth: Payer: Self-pay | Admitting: Neurology

## 2021-05-29 MED ORDER — LISINOPRIL 10 MG PO TABS
10.0000 mg | ORAL_TABLET | Freq: Every day | ORAL | 1 refills | Status: DC
  Filled 2021-05-29: qty 30, 30d supply, fill #0

## 2021-05-29 NOTE — Telephone Encounter (Signed)
Patient has been checking blood pressures and states it has been similar to her numbers in the office. 160's or higher over 70's. Please advise.

## 2021-05-30 LAB — IRON,TIBC AND FERRITIN PANEL
%SAT: 12 % (calc) — ABNORMAL LOW (ref 16–45)
Ferritin: 5 ng/mL — ABNORMAL LOW (ref 16–232)
Iron: 46 ug/dL (ref 45–160)
TIBC: 387 mcg/dL (calc) (ref 250–450)

## 2021-05-30 LAB — FSH/LH
FSH: 109.4 m[IU]/mL
LH: 62.6 m[IU]/mL

## 2021-05-30 LAB — TEST AUTHORIZATION

## 2021-05-30 LAB — HEMOGLOBIN A1C W/OUT EAG

## 2021-05-30 NOTE — Telephone Encounter (Signed)
Patient made aware Lisinopril sent to pharmacy. Appt made for two weeks for follow up. She also needs a1c at that time.

## 2021-06-03 ENCOUNTER — Encounter: Payer: Self-pay | Admitting: Physician Assistant

## 2021-06-09 DIAGNOSIS — Z1211 Encounter for screening for malignant neoplasm of colon: Secondary | ICD-10-CM | POA: Diagnosis not present

## 2021-06-12 ENCOUNTER — Encounter: Payer: Self-pay | Admitting: Physician Assistant

## 2021-06-12 ENCOUNTER — Other Ambulatory Visit (HOSPITAL_COMMUNITY): Payer: Self-pay

## 2021-06-12 ENCOUNTER — Other Ambulatory Visit: Payer: Self-pay

## 2021-06-12 ENCOUNTER — Ambulatory Visit: Payer: 59 | Admitting: Physician Assistant

## 2021-06-12 VITALS — BP 129/71 | HR 81 | Temp 98.2°F | Ht 63.0 in | Wt 207.0 lb

## 2021-06-12 DIAGNOSIS — R7301 Impaired fasting glucose: Secondary | ICD-10-CM

## 2021-06-12 DIAGNOSIS — R030 Elevated blood-pressure reading, without diagnosis of hypertension: Secondary | ICD-10-CM | POA: Diagnosis not present

## 2021-06-12 DIAGNOSIS — M79601 Pain in right arm: Secondary | ICD-10-CM

## 2021-06-12 DIAGNOSIS — I10 Essential (primary) hypertension: Secondary | ICD-10-CM

## 2021-06-12 DIAGNOSIS — R790 Abnormal level of blood mineral: Secondary | ICD-10-CM

## 2021-06-12 DIAGNOSIS — Z8249 Family history of ischemic heart disease and other diseases of the circulatory system: Secondary | ICD-10-CM | POA: Diagnosis not present

## 2021-06-12 LAB — POCT GLYCOSYLATED HEMOGLOBIN (HGB A1C): Hemoglobin A1C: 5.6 % (ref 4.0–5.6)

## 2021-06-12 MED ORDER — LISINOPRIL 10 MG PO TABS
10.0000 mg | ORAL_TABLET | Freq: Every day | ORAL | 1 refills | Status: DC
Start: 2021-06-12 — End: 2021-10-09
  Filled 2021-06-12: qty 90, 90d supply, fill #0
  Filled 2021-10-02: qty 90, 90d supply, fill #1

## 2021-06-12 NOTE — Progress Notes (Signed)
Subjective:    Patient ID: Tanya [name], female    DOB: 08-10-1970, 50 y.o.   MRN: [id number]  HPI Pt is a 50 yo obese female who presents to the clinic to follow up on HTN and labs. No problems or concerns with lisinopril. No CP or palpitations. No headaches or dizziness. She had a random shooting pain in right arm one day last week. Concerned her for heart. Not with exertion. Not happened a again. Family hx of heart disease.   .. Active Ambulatory Problems    Diagnosis Date Noted   GERD 07/19/2010   Dermatitis 07/16/2012   Seasonal allergies 07/16/2012   Trigger thumb of left hand 04/17/2014   IDA (iron deficiency anemia) 07/04/2016   Syncope and collapse 09/09/2016   Trigger ring finger of left hand 10/19/2016   Anxiety 10/19/2016   Tinnitus of left ear 11/28/2016   Neck pain on left side 11/28/2016   Meralgia paraesthetica, left 06/05/2017   Episodic paroxysmal hemicrania, not intractable 09/06/2017   Class 1 obesity due to excess calories without serious comorbidity with body mass index (BMI) of 33.0 to 33.9 in adult 09/06/2017   Dyslipidemia (high LDL; low HDL) 09/22/2017   Skin lesion 01/27/2018   Right arm pain 12/21/2019   Trigger thumb, right thumb 09/26/2020   No energy 05/27/2021   Post-menopausal 05/27/2021   Elevated blood pressure reading 05/27/2021   Microcytic anemia 05/28/2021   Low iron stores 06/12/2021   IFG (impaired fasting glucose) 06/12/2021   Family history of heart disease 06/12/2021   Hypertension 06/12/2021   Resolved Ambulatory Problems    Diagnosis Date Noted   No Resolved Ambulatory Problems   Past Medical History:  Diagnosis Date   GERD (gastroesophageal reflux disease)     Review of Systems    See HPI.  Objective:   Physical Exam Vitals reviewed.  Constitutional:      Appearance: Normal appearance. She is obese.  HENT:     Head: Normocephalic.  Cardiovascular:     Rate and Rhythm: Normal rate and regular rhythm.     Pulses:  Normal pulses.     Heart sounds: Normal heart sounds.  Pulmonary:     Effort: Pulmonary effort is normal.     Breath sounds: Normal breath sounds.  Neurological:     General: No focal deficit present.     Mental Status: She is alert and oriented to person, place, and time.  Psychiatric:        Mood and Affect: Mood normal.    .. Results for orders placed or performed in visit on 06/12/21  POCT glycosylated hemoglobin (Hb A1C)  Result Value Ref Range   Hemoglobin A1C 5.6 4.0 - 5.6 %   HbA1c POC (<> result, manual entry)     HbA1c, POC (prediabetic range)     HbA1c, POC (controlled diabetic range)           Assessment & Plan:  Marland KitchenMarland KitchenMykah was seen today for hypertension and ifg.  Diagnoses and all orders for this visit:  IFG (impaired fasting glucose) -     POCT glycosylated hemoglobin (Hb A1C)  Elevated blood pressure reading  Low iron stores  Hypertension, unspecified type -     lisinopril (ZESTRIL) 10 MG tablet; Take 1 tablet (10 mg total) by mouth daily. -     EKG 12-Lead  Family history of heart disease -     EKG 12-Lead  Right arm pain -     EKG  12-Lead  A1C is 5.6 and approaching pre-diabetes. Discussed diet and exercise with patient.   BP much better. Viewed log. Refilled lisinopril 10mg  daily.   Start iron daily for low iron stores.  Returned cologuard this week.   EKG no concerns. Suspect right arm pain was not related to heart. 10 year CV risk is 2.9 percent.   Follow up in 6 months.

## 2021-06-14 ENCOUNTER — Other Ambulatory Visit (HOSPITAL_COMMUNITY): Payer: Self-pay

## 2021-06-15 LAB — COLOGUARD: COLOGUARD: NEGATIVE

## 2021-06-17 NOTE — Progress Notes (Signed)
Negative cologuard. Repeat in 3 years with colonoscopy or another cologuard.

## 2021-06-22 ENCOUNTER — Other Ambulatory Visit (HOSPITAL_COMMUNITY): Payer: Self-pay

## 2021-07-24 DIAGNOSIS — H5203 Hypermetropia, bilateral: Secondary | ICD-10-CM | POA: Diagnosis not present

## 2021-07-24 DIAGNOSIS — H31091 Other chorioretinal scars, right eye: Secondary | ICD-10-CM | POA: Diagnosis not present

## 2021-08-08 DIAGNOSIS — Z1231 Encounter for screening mammogram for malignant neoplasm of breast: Secondary | ICD-10-CM | POA: Diagnosis not present

## 2021-08-08 LAB — HM MAMMOGRAPHY

## 2021-08-09 LAB — HM MAMMOGRAPHY

## 2021-08-11 IMAGING — DX DG HAND COMPLETE 3+V*R*
3 series · 3 of 3 positions shown · non-contrast
Comparison: None.

CLINICAL DATA: Pain and limitation of motion

EXAM:
RIGHT HAND - COMPLETE 3+ VIEW

[hand pa]
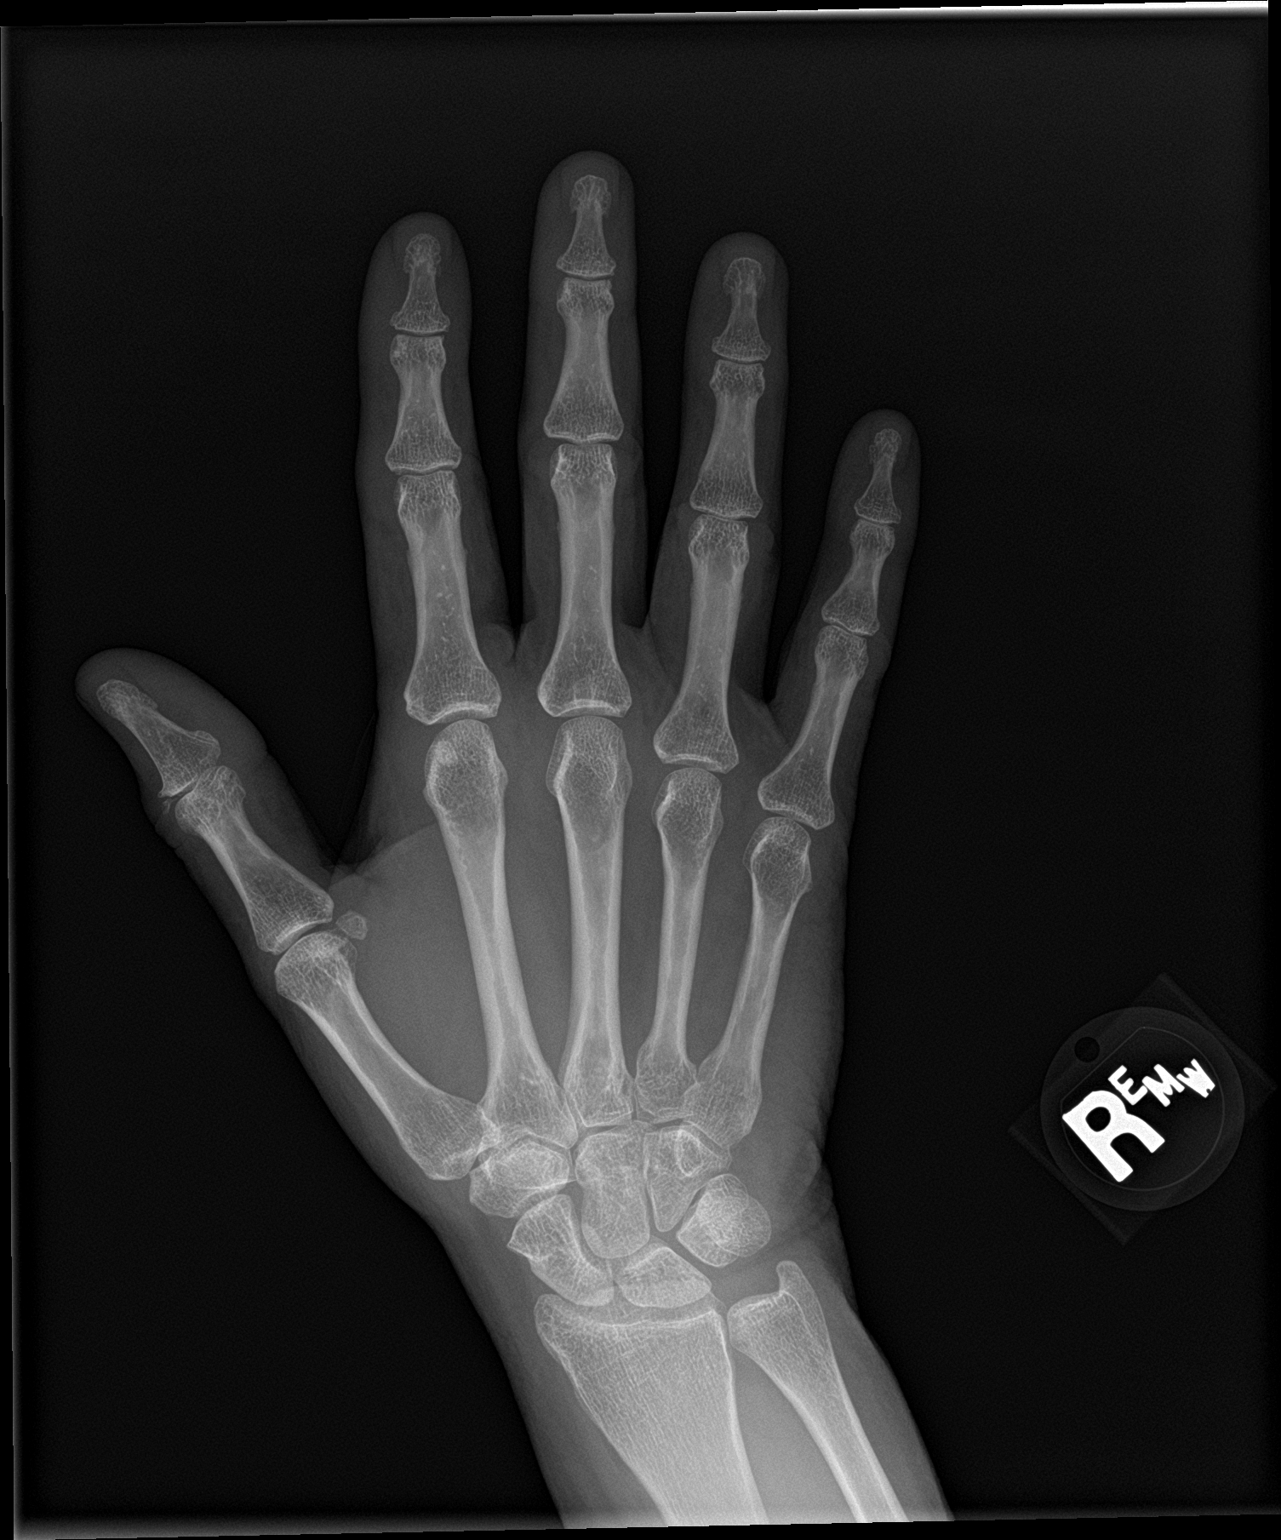

[hand obl]
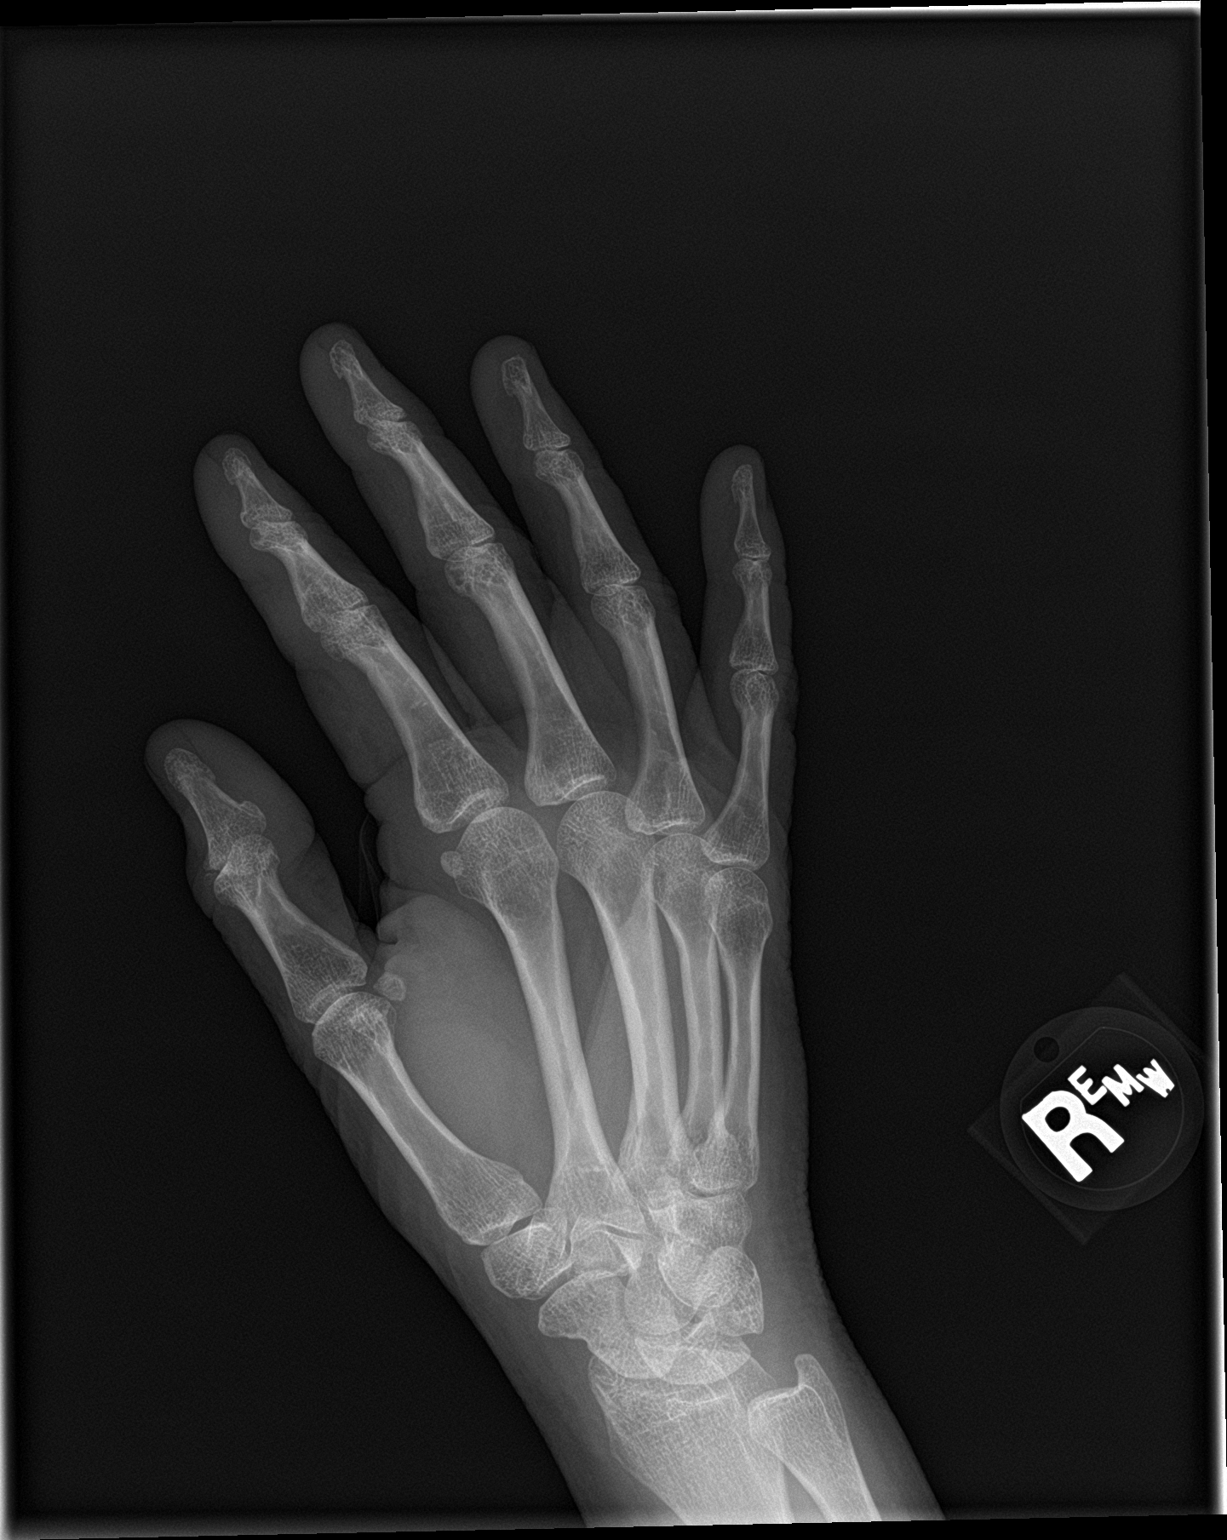

[hand lat]
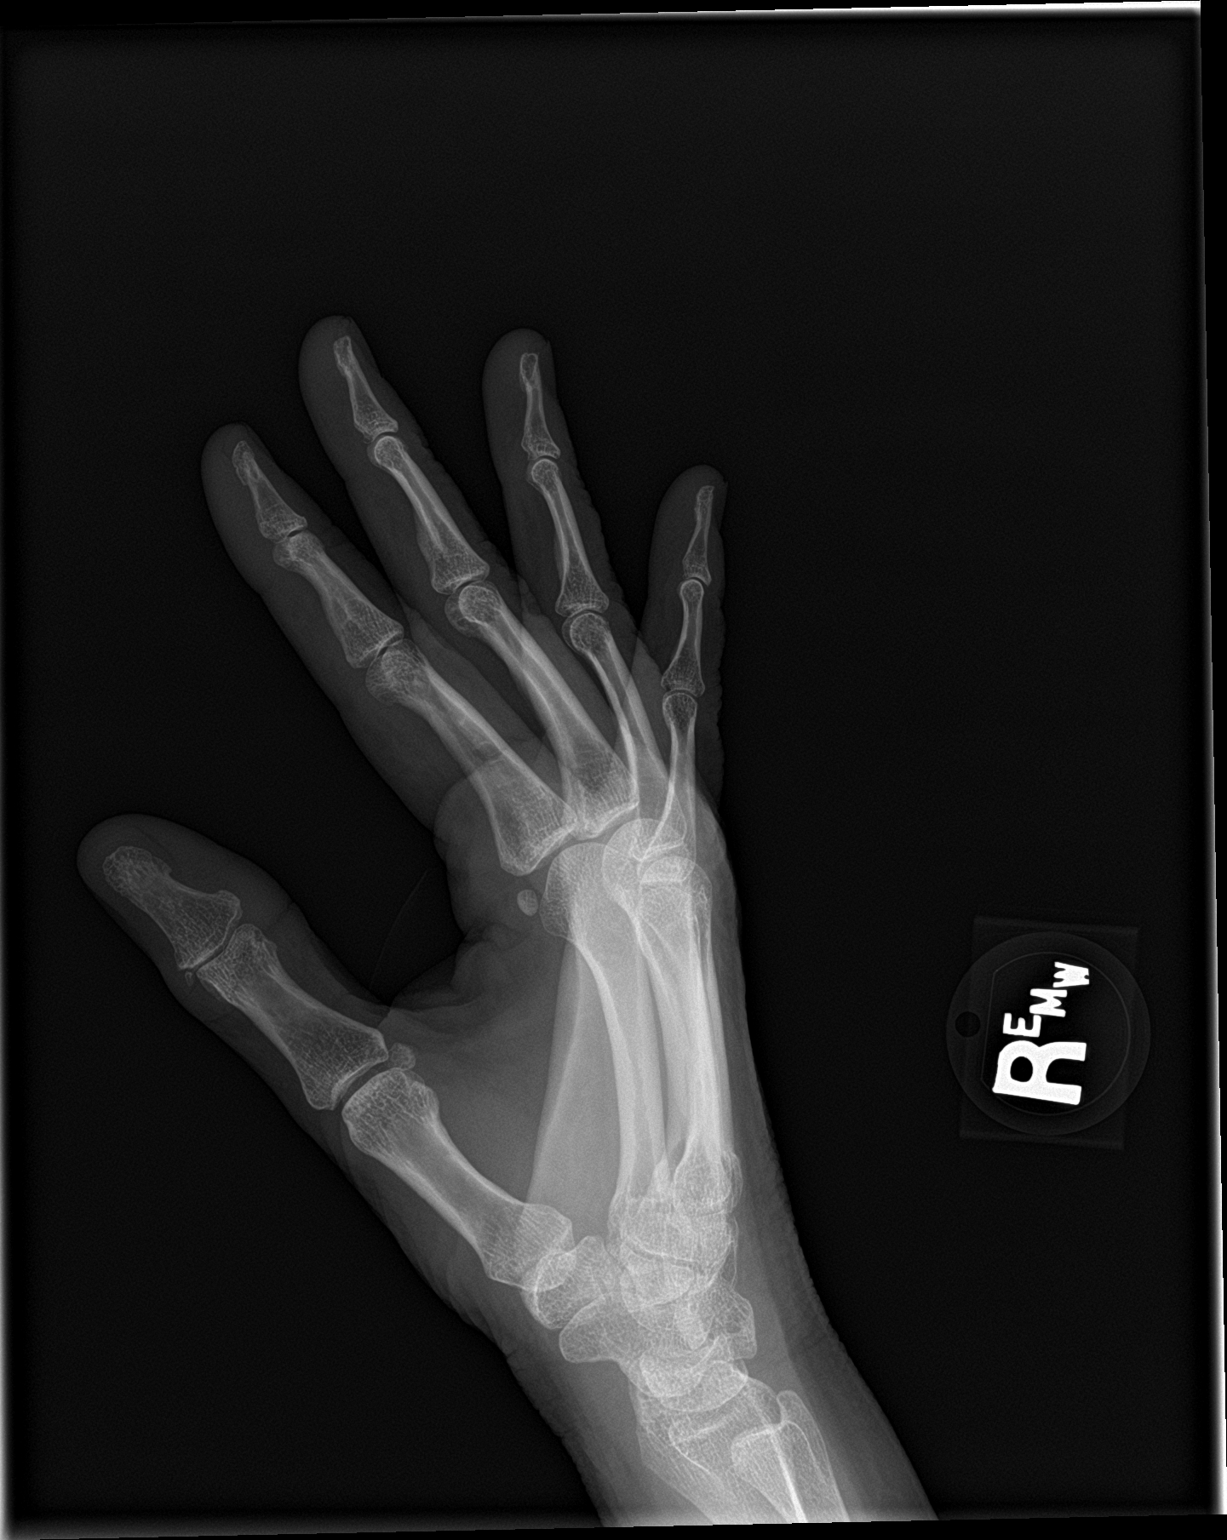

[3 of 3 positions shown; findings below may reference images not displayed]

FINDINGS: Frontal, oblique, and lateral views were obtained. No fracture or
dislocation. Joint spaces appear normal. No erosive change or
periostitis.
IMPRESSION: No fracture or dislocation.  No evident arthropathy.

## 2021-10-02 ENCOUNTER — Other Ambulatory Visit (HOSPITAL_COMMUNITY): Payer: Self-pay

## 2021-10-09 ENCOUNTER — Other Ambulatory Visit (HOSPITAL_COMMUNITY): Payer: Self-pay

## 2021-10-09 ENCOUNTER — Encounter: Payer: Self-pay | Admitting: Physician Assistant

## 2021-10-09 ENCOUNTER — Other Ambulatory Visit: Payer: Self-pay

## 2021-10-09 ENCOUNTER — Ambulatory Visit: Payer: 59 | Admitting: Physician Assistant

## 2021-10-09 VITALS — BP 144/66 | HR 85 | Ht 63.0 in | Wt 192.0 lb

## 2021-10-09 DIAGNOSIS — F4321 Adjustment disorder with depressed mood: Secondary | ICD-10-CM

## 2021-10-09 DIAGNOSIS — I10 Essential (primary) hypertension: Secondary | ICD-10-CM

## 2021-10-09 DIAGNOSIS — F419 Anxiety disorder, unspecified: Secondary | ICD-10-CM | POA: Diagnosis not present

## 2021-10-09 DIAGNOSIS — N907 Vulvar cyst: Secondary | ICD-10-CM | POA: Diagnosis not present

## 2021-10-09 MED ORDER — HYDROXYZINE HCL 10 MG PO TABS
10.0000 mg | ORAL_TABLET | Freq: Three times a day (TID) | ORAL | 0 refills | Status: DC | PRN
  Filled 2021-10-09: qty 90, 30d supply, fill #0

## 2021-10-09 MED ORDER — LISINOPRIL 10 MG PO TABS
10.0000 mg | ORAL_TABLET | Freq: Every day | ORAL | 1 refills | Status: DC
  Filled 2021-10-09: qty 90, 90d supply, fill #0

## 2021-10-09 NOTE — Progress Notes (Signed)
? ?Subjective:  ? ? Patient ID: Tanya [name], female    DOB: 1971-02-04, 51 y.o.   MRN: [id number] ? ?HPI ?Patient is a 51 year old female with hypertension who presents to the clinic for medication refills and to discuss recent death of her husband and nerves.  Her husband had a brain tumor and ended up having a hemorrhagic stroke and he passed away about 2 weeks ago.  She finds herself feeling very nervous and tearful.  At times her chest feels tight.  She can even wake up feeling like this.  She does start grief counseling next week which she hopes will help.  She wonders if she could have anything to help her.  She denies any suicidal thoughts or homicidal idealizations.  She is sleeping at night. ? ?She also complains of a nodule on her left labia.  She has noticed it for quite a while but seems to be getting a little bigger.  She denies any pain or drainage.  She denies any known trauma.  It seems to get a little irritated when it is rub from time to time.  She tried putting some clotrimazole cream on this and seem to make worse. ? ? ? ?.. ?Active Ambulatory Problems  ?  Diagnosis Date Noted  ? GERD 07/19/2010  ? Dermatitis 07/16/2012  ? Seasonal allergies 07/16/2012  ? Trigger thumb of left hand 04/17/2014  ? IDA (iron deficiency anemia) 07/04/2016  ? Syncope and collapse 09/09/2016  ? Trigger ring finger of left hand 10/19/2016  ? Anxiety 10/19/2016  ? Tinnitus of left ear 11/28/2016  ? Neck pain on left side 11/28/2016  ? Meralgia paraesthetica, left 06/05/2017  ? Episodic paroxysmal hemicrania, not intractable 09/06/2017  ? Class 1 obesity due to excess calories without serious comorbidity with body mass index (BMI) of 33.0 to 33.9 in adult 09/06/2017  ? Dyslipidemia (high LDL; low HDL) 09/22/2017  ? Skin lesion 01/27/2018  ? Right arm pain 12/21/2019  ? Trigger thumb, right thumb 09/26/2020  ? No energy 05/27/2021  ? Post-menopausal 05/27/2021  ? Elevated blood pressure reading 05/27/2021  ? Microcytic  anemia 05/28/2021  ? Low iron stores 06/12/2021  ? IFG (impaired fasting glucose) 06/12/2021  ? Family history of heart disease 06/12/2021  ? Hypertension 06/12/2021  ? Sebaceous cyst of labia 10/09/2021  ? ?Resolved Ambulatory Problems  ?  Diagnosis Date Noted  ? No Resolved Ambulatory Problems  ? ?Past Medical History:  ?Diagnosis Date  ? GERD (gastroesophageal reflux disease)   ? ? ? ?Review of Systems ?See HPI.  ?   ?Objective:  ? Physical Exam ?Vitals reviewed.  ?Constitutional:   ?   Appearance: Normal appearance. She is obese.  ?HENT:  ?   Head: Normocephalic.  ?Cardiovascular:  ?   Rate and Rhythm: Normal rate.  ?Pulmonary:  ?   Effort: Pulmonary effort is normal.  ?   Breath sounds: Normal breath sounds.  ?Genitourinary: ?   Vagina: No vaginal discharge.  ? ? ?Neurological:  ?   General: No focal deficit present.  ?   Mental Status: She is alert and oriented to person, place, and time.  ?Psychiatric:  ?   Comments: Tearful.   ? ? ? ? ? ?   ?Assessment & Plan:  ?..Nevae was seen today for grief and skin problem. ? ?Diagnoses and all orders for this visit: ? ?Grief ?-     hydrOXYzine (ATARAX) 10 MG tablet; Take 1 tablet (10 mg total) by mouth  3 (three) times daily as needed for anxiety. ? ?Hypertension, unspecified type ?-     lisinopril (ZESTRIL) 10 MG tablet; Take 1 tablet (10 mg total) by mouth daily. ? ?Anxiety ?-     hydrOXYzine (ATARAX) 10 MG tablet; Take 1 tablet (10 mg total) by mouth 3 (three) times daily as needed for anxiety. ? ?Sebaceous cyst of labia ? ? ?Discussed grief with patient. She will start counseling next week.  ?Discussed SSRI vs hydroxyzine vs benzo.  ?Will start with hydroxyzine as needed.  ? ?BP not to goal today but she is under a lot of stress and grief right now. Refilled lisinopril.  ? ?Discussed benign nature of cyst and can be removed if needed at a later date.  ? ?

## 2021-10-09 NOTE — Patient Instructions (Signed)
Epidermoid Cyst ?An epidermoid cyst, also known as epidermal cyst, is a sac made of skin tissue. The sac contains a substance called keratin. Keratin is a protein that is normally secreted through the hair follicles. When keratin becomes trapped in the top layer of skin (epidermis), it can form an epidermoid cyst. ?Epidermoid cysts can be found anywhere on your body. These cysts are usually harmless (benign), and they may not cause symptoms unless they become inflamed or infected. ?What are the causes? ?This condition may be caused by: ?A blocked hair follicle. ?A hair that curls and re-enters the skin instead of growing straight out of the skin (ingrown hair). ?A blocked pore. ?Irritated skin. ?An injury to the skin. ?Certain conditions that are passed along from parent to child (inherited). ?Human papillomavirus (HPV). This happens rarely when cysts occur on the bottom of the feet. ?Long-term (chronic) sun damage to the skin. ?What increases the risk? ?The following factors may make you more likely to develop an epidermoid cyst: ?Having acne. ?Being female. ?Having an injury to the skin. ?Being past puberty. ?Having certain rare genetic disorders. ?What are the signs or symptoms? ?The only symptom of this condition may be a small, painless lump underneath the skin. When an epidermal cyst ruptures, it may become inflamed. True infection in cysts is rare. Symptoms may include: ?Redness. ?Inflammation. ?Tenderness. ?Warmth. ?Keratin draining from the cyst. Keratin is grayish-white, bad-smelling substance. ?Pus draining from the cyst. ?How is this diagnosed? ?This condition is diagnosed with a physical exam. ?In some cases, you may have a sample of tissue (biopsy) taken from your cyst to be examined under a microscope or tested for bacteria. ?You may be referred to a health care provider who specializes in skin care (dermatologist). ?How is this treated? ?If a cyst becomes inflamed, treatment may include: ?Opening and  draining the cyst, done by a health care provider. After draining, minor surgery to remove the rest of the cyst may be done. ?Taking antibiotic medicine. ?Having injections of medicines (steroids) that help to reduce inflammation. ?Having surgery to remove the cyst. Surgery may be done if the cyst: ?Becomes large. ?Bothers you. ?Has a chance of turning into cancer. ?Do not try to open a cyst yourself. ?Follow these instructions at home: ?Medicines ?If you were prescribed an antibiotic medicine, take it it as told by your health care provider. Do not stop using the antibiotic even if you start to feel better. ?Take over-the-counter and prescription medicines only as told by your health care provider. ?General instructions ?Keep the area around your cyst clean and dry. ?Wear loose, dry clothing. ?Avoid touching your cyst. ?Check your cyst every day for signs of infection. Check for: ?Redness, swelling, or pain. ?Fluid or blood. ?Warmth. ?Pus or a bad smell. ?Keep all follow-up visits. This is important. ?How is this prevented? ?Wear clean, dry, clothing. ?Avoid wearing tight clothing. ?Keep your skin clean and dry. Take showers or baths every day. ?Contact a health care provider if: ?Your cyst develops symptoms of infection. ?Your condition is not improving or is getting worse. ?You develop a cyst that looks different from other cysts you have had. ?You have a fever. ?Get help right away if: ?Redness spreads from the cyst into the surrounding area. ?Summary ?An epidermoid cyst is a sac made of skin tissue. These cysts are usually harmless (benign), and they may not cause symptoms unless they become inflamed. ?If a cyst becomes inflamed, treatment may include surgery to open and drain the cyst,  or to remove it. Treatment may also include medicines by mouth or through an injection. ?Take over-the-counter and prescription medicines only as told by your health care provider. If you were prescribed an antibiotic medicine,  take it as told by your health care provider. Do not stop using the antibiotic even if you start to feel better. ?Contact a health care provider if your condition is not improving or is getting worse. ?Keep all follow-up visits as told by your health care provider. This is important. ?This information is not intended to replace advice given to you by your health care provider. Make sure you discuss any questions you have with your health care provider. ?Document Revised: 10/19/2019 Document Reviewed: 10/19/2019 ?Elsevier Patient Education ? [place]. ? ?

## 2021-12-05 ENCOUNTER — Telehealth: Payer: Self-pay | Admitting: Neurology

## 2021-12-05 NOTE — Telephone Encounter (Signed)
Patient has had ongoing shoulder pain, thought it was from sleeping position but now she thinks this could be from her Lisinopril. She also has cough at night. Wants to discuss switching Lisinopril for another medication.  ? ?Since new problem she needs a visit to discuss with Othniel Maret. Please call patient to schedule. [phone]. ?

## 2021-12-05 NOTE — Telephone Encounter (Signed)
Anything to do before appt?  ?

## 2021-12-05 NOTE — Telephone Encounter (Signed)
Patient has a visit scheduled for 12/10/21. ?

## 2021-12-06 ENCOUNTER — Other Ambulatory Visit (HOSPITAL_COMMUNITY): Payer: Self-pay

## 2021-12-06 MED ORDER — LOSARTAN POTASSIUM 25 MG PO TABS
25.0000 mg | ORAL_TABLET | Freq: Every day | ORAL | 0 refills | Status: DC
  Filled 2021-12-06: qty 30, 30d supply, fill #0

## 2021-12-06 NOTE — Addendum Note (Signed)
Addended byDonella Stade on: 12/06/2021 02:20 PM ? ? Modules accepted: Orders ? ?

## 2021-12-06 NOTE — Telephone Encounter (Signed)
Patient made aware of recommendations. Will start new medication and keep scheduled appt.  ?

## 2021-12-10 ENCOUNTER — Ambulatory Visit: Payer: 59 | Admitting: Physician Assistant

## 2021-12-10 VITALS — BP 140/65 | HR 69 | Ht 63.0 in | Wt 193.0 lb

## 2021-12-10 DIAGNOSIS — I10 Essential (primary) hypertension: Secondary | ICD-10-CM | POA: Diagnosis not present

## 2021-12-10 DIAGNOSIS — M25511 Pain in right shoulder: Secondary | ICD-10-CM

## 2021-12-10 DIAGNOSIS — G8929 Other chronic pain: Secondary | ICD-10-CM | POA: Diagnosis not present

## 2021-12-10 NOTE — Progress Notes (Signed)
Established Patient Office Visit  Subjective   Patient ID: Tanya [name], female    DOB: 28-Mar-1971  Age: 51 y.o. MRN: [id number]  Chief Complaint  Patient presents with   Follow-up   Hypertension    HPI Pt is a 51 yo obese female who presents to the clinic to follow up on HTN. She was started on lisinopril but then wanted to switch to losartan. She is taking '25mg'$  of losartan daily. No side effects. No CP, palpitations, headaches or vision changes.   Her grief is improving. She has good and bad days. She is back to work and trying to settle finances right now.   She is having right shoulder pain. This has been going on for quite some time. She has not done anything about it. She denies any injury. It seems to bother her worse at night.    .. Active Ambulatory Problems    Diagnosis Date Noted   GERD 07/19/2010   Dermatitis 07/16/2012   Seasonal allergies 07/16/2012   Trigger thumb of left hand 04/17/2014   IDA (iron deficiency anemia) 07/04/2016   Syncope and collapse 09/09/2016   Trigger ring finger of left hand 10/19/2016   Anxiety 10/19/2016   Tinnitus of left ear 11/28/2016   Neck pain on left side 11/28/2016   Meralgia paraesthetica, left 06/05/2017   Episodic paroxysmal hemicrania, not intractable 09/06/2017   Class 1 obesity due to excess calories without serious comorbidity with body mass index (BMI) of 33.0 to 33.9 in adult 09/06/2017   Dyslipidemia (high LDL; low HDL) 09/22/2017   Skin lesion 01/27/2018   Right arm pain 12/21/2019   Trigger thumb, right thumb 09/26/2020   No energy 05/27/2021   Post-menopausal 05/27/2021   Elevated blood pressure reading 05/27/2021   Microcytic anemia 05/28/2021   Low iron stores 06/12/2021   IFG (impaired fasting glucose) 06/12/2021   Family history of heart disease 06/12/2021   Hypertension 06/12/2021   Sebaceous cyst of labia 10/09/2021   Chronic right shoulder pain 12/12/2021   Resolved Ambulatory Problems    Diagnosis  Date Noted   No Resolved Ambulatory Problems   Past Medical History:  Diagnosis Date   GERD (gastroesophageal reflux disease)      ROS See HPI.    Objective:     BP 140/65   Pulse 69   Ht '5\' 3"'$  (1.6 m)   Wt 193 lb (87.5 kg)   SpO2 99%   BMI 34.19 kg/m  BP Readings from Last 3 Encounters:  12/10/21 140/65  10/09/21 (!) 144/66  06/12/21 129/71   Wt Readings from Last 3 Encounters:  12/10/21 193 lb (87.5 kg)  10/09/21 192 lb (87.1 kg)  06/12/21 207 lb (93.9 kg)      Physical Exam Vitals reviewed.  Constitutional:      Appearance: Normal appearance. She is obese.  HENT:     Head: Normocephalic.  Cardiovascular:     Rate and Rhythm: Normal rate and regular rhythm.     Pulses: Normal pulses.     Heart sounds: Normal heart sounds.  Pulmonary:     Effort: Pulmonary effort is normal.     Breath sounds: Normal breath sounds.  Musculoskeletal:     Comments: Right shoulder: Pain to palpation over deltoid and acromion Negative drop arm sign Pain with abduction but able to get to full ROM Strength 5/5 upper extermity Hand grip 5/5 No pain with hawkins/internal or external ROM.  Neurological:     General: No  focal deficit present.     Mental Status: She is alert and oriented to person, place, and time.  Psychiatric:        Mood and Affect: Mood normal.   Aspiration/Injection Procedure Note SHABREKA COULON [id number] 07/06/1971  Procedure: Injection Indications: pain  Procedure Details Consent: Risks of procedure as well as the alternatives and risks of each were explained to the (patient/caregiver).  Consent for procedure obtained. Time Out: Verified patient identification, verified procedure, site/side was marked, verified correct patient position, special equipment/implants available, medications/allergies/relevent history reviewed, required imaging and test results available.  Performed   Local Anesthesia Used:Ethyl Chloride Spray Injected 62m lidocaine 1  percent without epi and 139mdepomedrol '40mg'$  A sterile dressing was applied.  Patient did tolerate procedure well.  JaIran Planas      Assessment & Plan:  ..Marland KitchenMarland KitchenMadhurias seen today for follow-up and hypertension.  Diagnoses and all orders for this visit:  Hypertension, unspecified type -     COMPLETE METABOLIC PANEL WITH GFR  Chronic right shoulder pain   BP looking much better Continue losartan '25mg'$   Continue to work on low salt diet Goal BP under 140/90 Cmp ordered Right shoulder pain sounds like bursisits Injection done today Exercises to start at home If not improving consider xray and consult with Dr. T.Darene Lamer Follow up in 3 months.    Return in about 3 months (around 03/12/2022), or if symptoms worsen or fail to improve.    JaIran PlanasPA-C

## 2021-12-11 LAB — COMPLETE METABOLIC PANEL WITH GFR
AG Ratio: 2.2 (calc) (ref 1.0–2.5)
ALT: 8 U/L (ref 6–29)
AST: 15 U/L (ref 10–35)
Albumin: 4.3 g/dL (ref 3.6–5.1)
Alkaline phosphatase (APISO): 78 U/L (ref 37–153)
BUN/Creatinine Ratio: 38 (calc) — ABNORMAL HIGH (ref 6–22)
BUN: 33 mg/dL — ABNORMAL HIGH (ref 7–25)
CO2: 29 mmol/L (ref 20–32)
Calcium: 9.2 mg/dL (ref 8.6–10.4)
Chloride: 104 mmol/L (ref 98–110)
Creat: 0.88 mg/dL (ref 0.50–1.03)
Globulin: 2 g/dL (calc) (ref 1.9–3.7)
Glucose, Bld: 91 mg/dL (ref 65–99)
Potassium: 4.4 mmol/L (ref 3.5–5.3)
Sodium: 140 mmol/L (ref 135–146)
Total Bilirubin: 0.3 mg/dL (ref 0.2–1.2)
Total Protein: 6.3 g/dL (ref 6.1–8.1)
eGFR: 79 mL/min/{1.73_m2} (ref >=60)

## 2021-12-11 NOTE — Progress Notes (Signed)
Kidney function great. Kidneys look dry. Make sure you are drinking enough water daily.

## 2021-12-12 ENCOUNTER — Other Ambulatory Visit (HOSPITAL_COMMUNITY): Payer: Self-pay

## 2021-12-12 ENCOUNTER — Encounter: Payer: Self-pay | Admitting: Physician Assistant

## 2021-12-12 DIAGNOSIS — G8929 Other chronic pain: Secondary | ICD-10-CM | POA: Insufficient documentation

## 2021-12-12 MED ORDER — LOSARTAN POTASSIUM 25 MG PO TABS
25.0000 mg | ORAL_TABLET | Freq: Every day | ORAL | 1 refills | Status: DC
  Filled 2021-12-12 – 2022-01-06 (×2): qty 90, 90d supply, fill #0

## 2021-12-28 ENCOUNTER — Other Ambulatory Visit: Payer: Self-pay

## 2021-12-28 ENCOUNTER — Emergency Department
Admission: EM | Admit: 2021-12-28 | Discharge: 2021-12-28 | Disposition: A | Discharge status: Home / Self Care | Payer: 59 | Source: Home / Self Care

## 2021-12-28 DIAGNOSIS — R059 Cough, unspecified: Secondary | ICD-10-CM

## 2021-12-28 DIAGNOSIS — J0100 Acute maxillary sinusitis, unspecified: Secondary | ICD-10-CM

## 2021-12-28 DIAGNOSIS — J309 Allergic rhinitis, unspecified: Secondary | ICD-10-CM | POA: Diagnosis not present

## 2021-12-28 MED ORDER — FEXOFENADINE HCL 180 MG PO TABS: 180.0000 mg | ORAL_TABLET | Freq: Every day | ORAL | 0 refills | Status: AC

## 2021-12-28 MED ORDER — AMOXICILLIN-POT CLAVULANATE 875-125 MG PO TABS: 1.0000 | ORAL_TABLET | Freq: Two times a day (BID) | ORAL | 0 refills | Status: DC

## 2021-12-28 MED ORDER — BENZONATATE 200 MG PO CAPS: 200.0000 mg | ORAL_CAPSULE | Freq: Three times a day (TID) | ORAL | 0 refills | Status: AC | PRN

## 2021-12-28 NOTE — Discharge Instructions (Addendum)
The patient to take medication as directed with food to completion.  Advised patient to take Allegra with first dose of Augmentin for the next 5 of 7 days.  Advised may use Allegra as needed afterwards for concurrent postnasal drainage/drip.  Advised may use Tessalon Perles daily or as needed for cough.  Encouraged patient to increase daily water intake while taking these medications.  As patient if symptoms worsen and/or unresolved please follow-up with PCP or here for further evaluation.

## 2021-12-28 NOTE — ED Provider Notes (Signed)
[name]    CSN: [id number] Arrival date & time: 12/28/21  0802      History   Chief Complaint Chief Complaint  Patient presents with   Cough   Sore Throat    HPI Tanya [name] is a 51 y.o. female.   HPI 51 year old female presents with scratchy throat and cough for 1 week.  Reports postnasal drainage/drip for this time period as well.  Reports using OTC decongestant with little to no relief.  PMH significant for syncope and collapse, obesity, and GERD.  Past Medical History:  Diagnosis Date   GERD (gastroesophageal reflux disease)    Syncope and collapse 09/09/2016    Patient Active Problem List   Diagnosis Date Noted   Chronic right shoulder pain 12/12/2021   Sebaceous cyst of labia 10/09/2021   Low iron stores 06/12/2021   IFG (impaired fasting glucose) 06/12/2021   Family history of heart disease 06/12/2021   Hypertension 06/12/2021   Microcytic anemia 05/28/2021   No energy 05/27/2021   Post-menopausal 05/27/2021   Elevated blood pressure reading 05/27/2021   Trigger thumb, right thumb 09/26/2020   Right arm pain 12/21/2019   Skin lesion 01/27/2018   Dyslipidemia (high LDL; low HDL) 09/22/2017   Episodic paroxysmal hemicrania, not intractable 09/06/2017   Class 1 obesity due to excess calories without serious comorbidity with body mass index (BMI) of 33.0 to 33.9 in adult 09/06/2017   Meralgia paraesthetica, left 06/05/2017   Tinnitus of left ear 11/28/2016   Neck pain on left side 11/28/2016   Trigger ring finger of left hand 10/19/2016   Anxiety 10/19/2016   Syncope and collapse 09/09/2016   IDA (iron deficiency anemia) 07/04/2016   Trigger thumb of left hand 04/17/2014   Dermatitis 07/16/2012   Seasonal allergies 07/16/2012   GERD 07/19/2010    Past Surgical History:  Procedure Laterality Date   FINGER SURGERY     TENDON REPAIR     RT 4th digit   tri mallear      OB History   No obstetric history on file.      Home  Medications    Prior to Admission medications   Medication Sig Start Date End Date Taking? Authorizing Provider  amoxicillin-clavulanate (AUGMENTIN) 875-125 MG tablet Take 1 tablet by mouth every 12 (twelve) hours. 12/28/21  Yes Eliezer Lofts, FNP  benzonatate (TESSALON) 200 MG capsule Take 1 capsule (200 mg total) by mouth 3 (three) times daily as needed for up to 7 days for cough. 12/28/21 01/04/22 Yes Eliezer Lofts, FNP  fexofenadine [facility] ALLERGY) 180 MG tablet Take 1 tablet (180 mg total) by mouth daily for 15 days. 12/28/21 01/12/22 Yes Eliezer Lofts, FNP  pseudoephedrine (SUDAFED) 30 MG tablet Take 30 mg by mouth every 4 (four) hours as needed for congestion.   Yes [provider]  APPLE CIDER VINEGAR PO Take 1 tablet by mouth as needed. gummies - for heart burn    [provider]  calcium carbonate (TUMS EX) 750 MG chewable tablet Chew 1 tablet by mouth as needed. Takes 3-4 tablets    [provider]  CALCIUM PO Take by mouth in the morning and at bedtime.    [provider]  Ferrous Sulfate (IRON PO) Take by mouth daily.    [provider]  hydrOXYzine (ATARAX) 10 MG tablet Take 1 tablet by mouth 3 (three) times daily as needed for anxiety. 10/09/21   Breeback, Jade L, PA-C  losartan (COZAAR) 25 MG tablet  Take 1 tablet by mouth daily. 12/12/21   Donella Stade, PA-C  Multiple Vitamin tablet Take 1 tablet by mouth daily.    [provider]  naproxen sodium (ALEVE) 220 MG tablet Take 220 mg by mouth daily as needed.    [provider]  omeprazole (PRILOSEC OTC) 20 MG tablet Take 20 mg by mouth daily.    [provider]    Family History Family History  Problem Relation Age of Onset   Healthy Mother    Cancer Father        prostate   Heart attack Maternal Grandmother    Hypertension Maternal Grandmother    Parkinson's disease Maternal Grandfather     Social History Social History   Tobacco Use   Smoking  status: Never   Smokeless tobacco: Never  Vaping Use   Vaping Use: Never used  Substance Use Topics   Alcohol use: Not Currently   Drug use: No     Allergies   Other   Review of Systems Review of Systems  HENT:  Positive for sore throat.   Respiratory:  Positive for cough.   All other systems reviewed and are negative.   Physical Exam Triage Vital Signs ED Triage Vitals  Enc Vitals Group     BP 12/28/21 0827 130/80     Pulse Rate 12/28/21 0827 78     Resp 12/28/21 0827 20     Temp 12/28/21 0827 98.3 F (36.8 C)     Temp Source 12/28/21 0827 Oral     SpO2 12/28/21 0827 96 %     Weight 12/28/21 0823 193 lb (87.5 kg)     Height 12/28/21 0823 '5\' 4"'$  (1.626 m)     Head Circumference --      Peak Flow --      Pain Score 12/28/21 0823 3     Pain Loc --      Pain Edu? --      Excl. in [place]? --    No data found.  Updated Vital Signs BP 130/80   Pulse 78   Temp 98.3 F (36.8 C) (Oral)   Resp 20   Ht '5\' 4"'$  (1.626 m)   Wt 193 lb (87.5 kg)   SpO2 96%   BMI 33.13 kg/m       Physical Exam Vitals and nursing note reviewed.  Constitutional:      General: She is not in acute distress.    Appearance: She is obese. She is not ill-appearing.  HENT:     Head: Normocephalic and atraumatic.     Right Ear: Tympanic membrane and external ear normal.     Left Ear: Tympanic membrane and external ear normal.     Ears:     Comments: Moderate eustachian tube dysfunction noted bilaterally    Nose:     Comments: Turbinates are erythematous/edematous    Mouth/Throat:     Mouth: Mucous membranes are moist.     Pharynx: Oropharynx is clear. Uvula midline. Uvula swelling present.  Eyes:     Extraocular Movements: Extraocular movements intact.     Conjunctiva/sclera: Conjunctivae normal.     Pupils: Pupils are equal, round, and reactive to light.  Cardiovascular:     Rate and Rhythm: Normal rate and regular rhythm.     Pulses: Normal pulses.     Heart sounds: Normal heart  sounds.  Pulmonary:     Effort: Pulmonary effort is normal.     Breath sounds: Normal  breath sounds. No wheezing, rhonchi or rales.  Musculoskeletal:     Cervical back: Normal range of motion and neck supple.  Skin:    General: Skin is warm and dry.  Neurological:     General: No focal deficit present.     Mental Status: She is alert and oriented to person, place, and time.     UC Treatments / Results  Labs (all labs ordered are listed, but only abnormal results are displayed) Labs Reviewed - No data to display  EKG   Radiology No results found.  Procedures Procedures (including critical care time)  Medications Ordered in UC Medications - No data to display  Initial Impression / Assessment and Plan / UC Course  I have reviewed the triage vital signs and the nursing notes.  Pertinent labs & imaging results that were available during my care of the patient were reviewed by me and considered in my medical decision making (see chart for details).     1.  Subacute maxillary sinusitis-Augmentin; 2.  Cough-Rx'd Tessalon Perles; 3.  Allergic rhinitis-Rx'd Allegra. The patient to take medication as directed with food to completion.  Advised patient to take Allegra with first dose of Augmentin for the next 5 of 7 days.  Advised may use Allegra as needed afterwards for concurrent postnasal drainage/drip.  Advised may use Tessalon Perles daily or as needed for cough.  Encouraged patient to increase daily water intake while taking these medications.  As patient if symptoms worsen and/or unresolved please follow-up with PCP or here for further evaluation.  Patient discharged home, hemodynamically stable. Final Clinical Impressions(s) / UC Diagnoses   Final diagnoses:  Subacute maxillary sinusitis  Cough, unspecified type  Allergic rhinitis, unspecified seasonality, unspecified trigger     Discharge Instructions      The patient to take medication as directed with food to  completion.  Advised patient to take Allegra with first dose of Augmentin for the next 5 of 7 days.  Advised may use Allegra as needed afterwards for concurrent postnasal drainage/drip.  Advised may use Tessalon Perles daily or as needed for cough.  Encouraged patient to increase daily water intake while taking these medications.  As patient if symptoms worsen and/or unresolved please follow-up with PCP or here for further evaluation.     ED Prescriptions     Medication Sig Dispense Auth. Provider   amoxicillin-clavulanate (AUGMENTIN) 875-125 MG tablet Take 1 tablet by mouth every 12 (twelve) hours. 14 tablet Eliezer Lofts, FNP   fexofenadine [facility] ALLERGY) 180 MG tablet Take 1 tablet (180 mg total) by mouth daily for 15 days. 15 tablet Eliezer Lofts, FNP   benzonatate (TESSALON) 200 MG capsule Take 1 capsule (200 mg total) by mouth 3 (three) times daily as needed for up to 7 days for cough. 40 capsule Eliezer Lofts, FNP      PDMP not reviewed this encounter.   Eliezer Lofts, [place] 12/28/21 [phone]

## 2021-12-28 NOTE — ED Triage Notes (Signed)
Pt presents to Urgent Care with c/o cough and intermittent "scratchy throat" x 1 week. States she thinks she had a fever at onset of s/s. Negative COVID test 3 days ago.

## 2022-01-06 ENCOUNTER — Other Ambulatory Visit (HOSPITAL_COMMUNITY): Payer: Self-pay

## 2022-01-16 ENCOUNTER — Ambulatory Visit: Payer: 59 | Admitting: Physician Assistant

## 2022-01-16 ENCOUNTER — Encounter: Payer: Self-pay | Admitting: Physician Assistant

## 2022-01-16 VITALS — BP 151/56 | HR 71 | Ht 64.0 in | Wt 196.0 lb

## 2022-01-16 DIAGNOSIS — N907 Vulvar cyst: Secondary | ICD-10-CM | POA: Diagnosis not present

## 2022-01-16 NOTE — Patient Instructions (Signed)
Epidermoid Cyst Removal, Care After This sheet gives you information about how to care for yourself after your procedure. Your health care provider may also give you more specific instructions. If you have problems or questions, contact your health care provider. What can I expect after the procedure? After the procedure, it is common to have: Soreness in the area where your cyst was removed. Tightness or itchiness from the stitches (sutures) in your skin. Follow these instructions at home: Medicines Take over-the-counter and prescription medicines only as told by your health care provider. If you were prescribed an antibiotic medicine or ointment, take or apply it as told by your health care provider. Do not stop using the antibiotic even if you start to feel better. Incision care  Follow instructions from your health care provider about how to take care of your incision. Make sure you: Wash your hands with soap and water for at least 20 seconds before you change your bandage (dressing). If soap and water are not available, use hand sanitizer. Change your dressing as told by your health care provider. Leave sutures, skin glue, or adhesive strips in place. These skin closures may need to stay in place for 1-2 weeks or longer. If adhesive strip edges start to loosen and curl up, you may trim the loose edges. Do not remove adhesive strips completely unless your health care provider tells you to do that. Keep the dressing dry until your health care provider says that it can be removed. After your dressing is off, check your incision area every day for signs of infection. Check for: Redness, swelling, or pain. Fluid or blood. Warmth. Pus or a bad smell. General instructions Do not take baths, swim, or use a hot tub until your health care provider approves. Ask your health care provider if you may take showers. You may only be allowed to take sponge baths. Your health care provider may ask you to  avoid contact sports or activities that take a lot of effort. Do not do anything that stretches or puts pressure on your incision. You can return to your normal diet. Keep all follow-up visits. This is important. Contact a health care provider if: You have a fever. You have redness, swelling, or pain in the incision area. You have fluid or blood coming from your incision. You have pus or a bad smell coming from your incision. Your incision feels warm to the touch. Your cyst grows back. Get help right away if: If the incision site suddenly increases in size and you have pain at the incision site. You may be checked for a collection of blood under the skin from the procedure (hematoma). Summary After the procedure, it is common to have soreness in the area where your cyst was removed. Take or apply over-the-counter and prescription medicines only as told by your health care provider. Follow instructions from your health care provider about how to take care of your incision. This information is not intended to replace advice given to you by your health care provider. Make sure you discuss any questions you have with your health care provider. Document Revised: 10/19/2019 Document Reviewed: 10/19/2019 Elsevier Patient Education  2023 Elsevier Inc.  

## 2022-01-24 ENCOUNTER — Encounter: Payer: Self-pay | Admitting: Physician Assistant

## 2022-01-24 ENCOUNTER — Ambulatory Visit: Payer: 59 | Admitting: Physician Assistant

## 2022-01-24 VITALS — BP 135/65 | HR 67

## 2022-01-24 DIAGNOSIS — N907 Vulvar cyst: Secondary | ICD-10-CM

## 2022-01-24 DIAGNOSIS — Z4802 Encounter for removal of sutures: Secondary | ICD-10-CM

## 2022-01-24 NOTE — Progress Notes (Signed)
   Established Patient Office Visit  Subjective   Patient ID: Tanya [name], female    DOB: Jul 12, 1971  Age: 51 y.o. MRN: [id number]  Chief Complaint  Patient presents with   Suture / Staple Removal    HPI Pt had a sebaceous cyst of left labia removed 1 week ago. Pt returns to have suture removed. Pt is not having any problems or concerns.   ROS    Objective:     Physical Exam   Incision of left labia healing well with one simple interrupted suture present Suture removed with one cut and pull through    Assessment & Plan:  Marland KitchenMarland KitchenBrytnee was seen today for suture / staple removal.  Diagnoses and all orders for this visit:  Encounter for removal of sutures  Sebaceous cyst of labia   Suture removed without complication Discussed keeping area clean and using topical bactroban for the next 2 days Follow up as needed   Iran Planas, PA-C

## 2022-01-24 NOTE — Patient Instructions (Signed)
Suture Removal, Care After The following information offers guidance on how to care for yourself after your procedure. Your health care provider may also give you more specific instructions. If you have problems or questions, contact your health care provider. What can I expect after the procedure? After your stitches (sutures) are removed, it is common to have: Some discomfort and swelling in the area. Slight redness in the area. Follow these instructions at home: If you have a dressing: Wash your hands with soap and water for at least 20 seconds before and after you change your bandage (dressing). If soap and water are not available, use hand sanitizer. Change your dressing as told by your health care provider. If your dressing becomes wet or dirty, or develops a bad smell, change it as soon as possible. If your dressing sticks to your skin, pour warm, clean water over it until it loosens and can be removed without pulling apart the wound edges. Pat the area dry with a soft, clean towel. Do not rub the wound because that may cause bleeding. Wound care  Check your wound every day for signs of infection. Check for: More redness, swelling, or pain. Fluid or blood. New warmth, a rash, or hardness at the wound site. Pus or a bad smell. Wash your hands with soap and water for at least 20 seconds before and after touching your wound. If soap and water are not available, use hand sanitizer. Keep the wound area dry and clean. Clean and pat the wound dry as told by your health care provider. Apply cream or ointment only as told by your health care provider. If skin glue or adhesive strips were applied after sutures were removed, leave these closures in place. They may need to stay in place for 2 weeks or longer. If adhesive strip edges start to loosen and curl up, you may trim the loose edges. Do not remove adhesive strips completely unless your health care provider tells you to do that. Continue to  protect the wound from injury. Do not pick at your wound. Picking can cause an infection. Bathing Do not take baths, swim, or use a hot tub until your health care provider approves. Ask your health care provider if you may take showers. Follow these steps for showering: If you have a dressing, remove it before getting into the shower. In the shower, allow soapy water to get on the wound. Avoid scrubbing the wound. When you get out of the shower, dry the wound by patting it with a clean towel. Reapply a dressing over the wound, if needed. Scar care When your wound has completely healed, help decrease the size of your scar by: Wearing sunscreen over the scar or covering it with clothing when you are outside. New scars get sunburned easily, which can make scarring worse. Gently massaging the scarred area. This can decrease scar thickness. General instructions Take over-the-counter and prescription medicines only as told by your health care provider. Keep all follow-up visits. This is important. Contact a health care provider if: You have more redness, swelling, or pain around your wound. You have fluid or blood coming from your wound. You have new warmth, a rash, or hardness at the wound site. You have pus or a bad smell coming from your wound. Your wound opens up. Get help right away if: You have a fever or chills. You have red streaks coming from your wound. Summary After your sutures are removed, it is common to have some discomfort   and swelling in the area. Wash your hands with soap and water before you change your bandage (dressing). Keep the wound area dry and clean. Do not take baths, swim, or use a hot tub until your health care provider approves. This information is not intended to replace advice given to you by your health care provider. Make sure you discuss any questions you have with your health care provider. Document Revised: 11/06/2020 Document Reviewed:  11/06/2020 Elsevier Patient Education  2023 Elsevier Inc.  

## 2022-02-10 ENCOUNTER — Ambulatory Visit (INDEPENDENT_AMBULATORY_CARE_PROVIDER_SITE_OTHER): Payer: 59

## 2022-02-10 ENCOUNTER — Ambulatory Visit: Payer: 59 | Admitting: Sports Medicine

## 2022-02-10 DIAGNOSIS — M65311 Trigger thumb, right thumb: Secondary | ICD-10-CM

## 2022-02-10 DIAGNOSIS — M7541 Impingement syndrome of right shoulder: Secondary | ICD-10-CM | POA: Diagnosis not present

## 2022-02-10 NOTE — Progress Notes (Signed)
    Procedures performed today:    Procedure: Real-time Ultrasound Guided injection of the right flexor pollicis longus tendon sheath Device: Samsung HS60  Verbal informed consent obtained.  Time-out conducted.  Noted no overlying erythema, induration, or other signs of local infection.  Skin prepped in a sterile fashion.  Local anesthesia: Topical Ethyl chloride.  With sterile technique and under real time ultrasound guidance: Noted flexor tendon nodule, 1/2 cc lidocaine, 1/2 cc kenalog 40 injected easily. Completed without difficulty  Advised to call if fevers/chills, erythema, induration, drainage, or persistent bleeding.  Images permanently stored and available for review in PACS.  Impression: Technically successful ultrasound guided injection.  Independent interpretation of notes and tests performed by another provider:   None.  Brief History, Exam, Impression, and Recommendations:    Trigger thumb, right thumb This is a very pleasant 51 year old female, we have not seen her in about a year and a half, we injected her right flexor pollicis longus tendon sheath for trigger thumb, she did well until recently, having recurrence of pain, triggering, repeat flexor pollicis longus tendon sheath injection today, return to see me as needed.  Impingement syndrome, shoulder, right Months of discomfort right shoulder localized over the deltoid and upper arm, worse with abduction, worse at night, weak to abduction. Positive impingement signs, adding home conditioning. Thera-Band, she will take NSAIDs as needed. Return to see me 6 weeks as needed. Sounds like she did have a subacromial injection not too long ago.    ____________________________________________ Gwen Her. Dianah Field, M.D., ABFM., CAQSM., AME. Primary Care and Sports Medicine [facility]  Adjunct Professor of [employer]  Engineer, structural

## 2022-02-10 NOTE — Assessment & Plan Note (Addendum)
This is a very pleasant 51 year old female, we have not seen her in about a year and a half, we injected her right flexor pollicis longus tendon sheath for trigger thumb, she did well until recently, having recurrence of pain, triggering, repeat flexor pollicis longus tendon sheath injection today, return to see me as needed.

## 2022-02-10 NOTE — Assessment & Plan Note (Signed)
Months of discomfort right shoulder localized over the deltoid and upper arm, worse with abduction, worse at night, weak to abduction. Positive impingement signs, adding home conditioning. Thera-Band, she will take NSAIDs as needed. Return to see me 6 weeks as needed. Sounds like she did have a subacromial injection not too long ago.

## 2022-03-21 ENCOUNTER — Encounter: Payer: Self-pay | Admitting: Physician Assistant

## 2022-03-21 ENCOUNTER — Ambulatory Visit: Payer: 59 | Admitting: Physician Assistant

## 2022-03-21 ENCOUNTER — Other Ambulatory Visit (HOSPITAL_COMMUNITY): Payer: Self-pay

## 2022-03-21 VITALS — BP 129/61 | HR 76 | Ht 64.0 in | Wt 203.0 lb

## 2022-03-21 DIAGNOSIS — Z79899 Other long term (current) drug therapy: Secondary | ICD-10-CM | POA: Diagnosis not present

## 2022-03-21 DIAGNOSIS — I10 Essential (primary) hypertension: Secondary | ICD-10-CM | POA: Diagnosis not present

## 2022-03-21 MED ORDER — LOSARTAN POTASSIUM 25 MG PO TABS
25.0000 mg | ORAL_TABLET | Freq: Every day | ORAL | 1 refills | Status: DC
  Filled 2022-03-21: qty 90, 90d supply, fill #0
  Filled 2022-07-03: qty 90, 90d supply, fill #1

## 2022-03-21 NOTE — Progress Notes (Signed)
Established Patient Office Visit  Subjective   Patient ID: Tanya [name], female    DOB: 01-01-1971  Age: 51 y.o. MRN: [id number]  Chief Complaint  Patient presents with   Follow-up    HPI Pt is a 51 yo female who presents to the clinic to follow up in HTN. No CP, palpitations, headaches or vision changes. Doing great on losartan. Not checking BP at home. No concerns.   . Active Ambulatory Problems    Diagnosis Date Noted   GERD 07/19/2010   Dermatitis 07/16/2012   Seasonal allergies 07/16/2012   Trigger thumb of left hand 04/17/2014   IDA (iron deficiency anemia) 07/04/2016   Syncope and collapse 09/09/2016   Trigger ring finger of left hand 10/19/2016   Anxiety 10/19/2016   Tinnitus of left ear 11/28/2016   Neck pain on left side 11/28/2016   Meralgia paraesthetica, left 06/05/2017   Episodic paroxysmal hemicrania, not intractable 09/06/2017   Class 1 obesity due to excess calories without serious comorbidity with body mass index (BMI) of 33.0 to 33.9 in adult 09/06/2017   Dyslipidemia (high LDL; low HDL) 09/22/2017   Skin lesion 01/27/2018   Impingement syndrome, shoulder, right 12/21/2019   Trigger thumb, right thumb 09/26/2020   No energy 05/27/2021   Post-menopausal 05/27/2021   Elevated blood pressure reading 05/27/2021   Microcytic anemia 05/28/2021   Low iron stores 06/12/2021   IFG (impaired fasting glucose) 06/12/2021   Family history of heart disease 06/12/2021   Hypertension 06/12/2021   Epidermal cyst of vulva 10/09/2021   Chronic right shoulder pain 12/12/2021   Resolved Ambulatory Problems    Diagnosis Date Noted   No Resolved Ambulatory Problems   Past Medical History:  Diagnosis Date   GERD (gastroesophageal reflux disease)       Review of Systems  All other systems reviewed and are negative.     Objective:     BP 129/61   Pulse 76   Ht '5\' 4"'$  (1.626 m)   Wt 203 lb (92.1 kg)   SpO2 99%   BMI 34.84 kg/m  BP Readings from Last 3  Encounters:  03/21/22 129/61  01/24/22 135/65  01/16/22 (!) 151/56   Wt Readings from Last 3 Encounters:  03/21/22 203 lb (92.1 kg)  01/16/22 196 lb (88.9 kg)  12/28/21 193 lb (87.5 kg)      Physical Exam Constitutional:      Appearance: Normal appearance. She is obese.  HENT:     Head: Normocephalic.  Cardiovascular:     Rate and Rhythm: Normal rate and regular rhythm.     Pulses: Normal pulses.  Pulmonary:     Effort: Pulmonary effort is normal.     Breath sounds: Normal breath sounds.  Musculoskeletal:     Right lower leg: No edema.     Left lower leg: No edema.  Neurological:     General: No focal deficit present.     Mental Status: She is alert and oriented to person, place, and time.  Psychiatric:        Mood and Affect: Mood normal.          Assessment & Plan:  Marland KitchenMarland KitchenJessalynn was seen today for follow-up.  Diagnoses and all orders for this visit:  Primary hypertension -     COMPLETE METABOLIC PANEL WITH GFR -     losartan (COZAAR) 25 MG tablet; Take 1 tablet by mouth daily.  Replaces lisinopril  Medication management -     COMPLETE  METABOLIC PANEL WITH GFR   BP looks great Continue on losartan Cmp ordered Follow up in 6 months     Iran Planas, PA-C

## 2022-03-22 LAB — COMPLETE METABOLIC PANEL WITH GFR
AG Ratio: 2 (calc) (ref 1.0–2.5)
ALT: 9 U/L (ref 6–29)
AST: 19 U/L (ref 10–35)
Albumin: 4.3 g/dL (ref 3.6–5.1)
Alkaline phosphatase (APISO): 82 U/L (ref 37–153)
BUN: 25 mg/dL (ref 7–25)
CO2: 30 mmol/L (ref 20–32)
Calcium: 9.7 mg/dL (ref 8.6–10.4)
Chloride: 103 mmol/L (ref 98–110)
Creat: 1 mg/dL (ref 0.50–1.03)
Globulin: 2.1 g/dL (calc) (ref 1.9–3.7)
Glucose, Bld: 95 mg/dL (ref 65–99)
Potassium: 4.6 mmol/L (ref 3.5–5.3)
Sodium: 141 mmol/L (ref 135–146)
Total Bilirubin: 0.5 mg/dL (ref 0.2–1.2)
Total Protein: 6.4 g/dL (ref 6.1–8.1)
eGFR: 67 mL/min/{1.73_m2} (ref >=60)

## 2022-03-24 NOTE — Progress Notes (Signed)
Normal range labs. GFR is a little decreased. Make sure you are staying hydrated.

## 2022-06-23 ENCOUNTER — Ambulatory Visit
Admission: EM | Admit: 2022-06-23 | Discharge: 2022-06-23 | Disposition: A | Discharge status: Home / Self Care | Payer: 59 | Attending: Family Medicine | Admitting: Family Medicine

## 2022-06-23 DIAGNOSIS — J0100 Acute maxillary sinusitis, unspecified: Secondary | ICD-10-CM

## 2022-06-23 DIAGNOSIS — R059 Cough, unspecified: Secondary | ICD-10-CM

## 2022-06-23 HISTORY — DX: Essential (primary) hypertension: I10

## 2022-06-23 MED ORDER — AMOXICILLIN-POT CLAVULANATE 875-125 MG PO TABS: 1.0000 | ORAL_TABLET | Freq: Two times a day (BID) | ORAL | 0 refills | Status: DC

## 2022-06-23 NOTE — ED Triage Notes (Signed)
Pt presents to Urgent Care with c/o sore throat and cough x 3 days. Has not done COVID test.

## 2022-06-23 NOTE — Discharge Instructions (Signed)
Advised patient to take medication as directed with food to completion.  Encouraged patient to increase daily water intake to 64 ounces per day while taking this medication.  Advised if symptoms worsen and/or unresolved please follow-up with PCP or here for further evaluation. 

## 2022-06-23 NOTE — ED Provider Notes (Signed)
[name]    CSN: [id number] Arrival date & time: 06/23/22  0800      History   Chief Complaint Chief Complaint  Patient presents with   Sore Throat   Cough    HPI Tanya [name] is a 51 y.o. female.   HPI 51 year old female presents with sore throat and cough for 3 days.  Reports not performing COVID-19 test.  PMH significant for morbid obesity, syncope and collapse, and HTN.  Past Medical History:  Diagnosis Date   GERD (gastroesophageal reflux disease)    Hypertension    Syncope and collapse 09/09/2016    Patient Active Problem List   Diagnosis Date Noted   Chronic right shoulder pain 12/12/2021   Epidermal cyst of vulva 10/09/2021   Low iron stores 06/12/2021   IFG (impaired fasting glucose) 06/12/2021   Family history of heart disease 06/12/2021   Hypertension 06/12/2021   Microcytic anemia 05/28/2021   No energy 05/27/2021   Post-menopausal 05/27/2021   Elevated blood pressure reading 05/27/2021   Trigger thumb, right thumb 09/26/2020   Impingement syndrome, shoulder, right 12/21/2019   Skin lesion 01/27/2018   Dyslipidemia (high LDL; low HDL) 09/22/2017   Episodic paroxysmal hemicrania, not intractable 09/06/2017   Class 1 obesity due to excess calories without serious comorbidity with body mass index (BMI) of 33.0 to 33.9 in adult 09/06/2017   Meralgia paraesthetica, left 06/05/2017   Tinnitus of left ear 11/28/2016   Neck pain on left side 11/28/2016   Trigger ring finger of left hand 10/19/2016   Anxiety 10/19/2016   Syncope and collapse 09/09/2016   IDA (iron deficiency anemia) 07/04/2016   Trigger thumb of left hand 04/17/2014   Dermatitis 07/16/2012   Seasonal allergies 07/16/2012   GERD 07/19/2010    Past Surgical History:  Procedure Laterality Date   FINGER SURGERY     TENDON REPAIR     RT 4th digit   tri mallear      OB History   No obstetric history on file.      Home Medications    Prior to Admission  medications   Medication Sig Start Date End Date Taking? Authorizing Provider  amoxicillin-clavulanate (AUGMENTIN) 875-125 MG tablet Take 1 tablet by mouth every 12 (twelve) hours. 06/23/22  Yes Eliezer Lofts, FNP  APPLE CIDER VINEGAR PO Take 1 tablet by mouth as needed. gummies - for heart burn    [provider]  calcium carbonate (TUMS EX) 750 MG chewable tablet Chew 1 tablet by mouth as needed. Takes 3-4 tablets    [provider]  CALCIUM PO Take by mouth in the morning and at bedtime.    [provider]  Ferrous Sulfate (IRON PO) Take by mouth daily.    [provider]  fexofenadine (ALLEGRA ALLERGY) 180 MG tablet Take 1 tablet (180 mg total) by mouth daily for 15 days. 12/28/21 01/24/22  Eliezer Lofts, FNP  losartan (COZAAR) 25 MG tablet Take 1 tablet by mouth daily.  Replaces lisinopril 03/21/22   Iran Planas L, PA-C  Multiple Vitamin tablet Take 1 tablet by mouth daily.    [provider]  naproxen sodium (ALEVE) 220 MG tablet Take 220 mg by mouth daily as needed.    [provider]  omeprazole (PRILOSEC OTC) 20 MG tablet Take 20 mg by mouth daily.    [provider]    Family History Family History  Problem Relation Age of Onset   Healthy Mother  Cancer Father        prostate   Heart attack Maternal Grandmother    Hypertension Maternal Grandmother    Parkinson's disease Maternal Grandfather     Social History Social History   Tobacco Use   Smoking status: Never   Smokeless tobacco: Never  Vaping Use   Vaping Use: Never used  Substance Use Topics   Alcohol use: Not Currently   Drug use: No     Allergies   Other   Review of Systems Review of Systems  HENT:  Positive for congestion and sore throat.   Respiratory:  Positive for cough.   All other systems reviewed and are negative.    Physical Exam Triage Vital Signs ED Triage Vitals  Enc Vitals Group     BP 06/23/22 0821 134/85     Pulse  Rate 06/23/22 0821 84     Resp 06/23/22 0821 20     Temp 06/23/22 0821 99.5 F (37.5 C)     Temp src --      SpO2 06/23/22 0821 94 %     Weight 06/23/22 0817 200 lb (90.7 kg)     Height 06/23/22 0817 '5\' 4"'$  (1.626 m)     Head Circumference --      Peak Flow --      Pain Score 06/23/22 0817 4     Pain Loc --      Pain Edu? --      Excl. in [place]? --    No data found.  Updated Vital Signs BP 134/85 (BP Location: Right Arm)   Pulse 84   Temp 99.5 F (37.5 C)   Resp 20   Ht '5\' 4"'$  (1.626 m)   Wt 200 lb (90.7 kg)   LMP  (LMP Unknown)   SpO2 94%   BMI 34.33 kg/m    Physical Exam Vitals and nursing note reviewed.  Constitutional:      Appearance: She is well-developed. She is obese. She is ill-appearing.  HENT:     Head: Normocephalic and atraumatic.     Right Ear: Tympanic membrane and ear canal normal.     Left Ear: Tympanic membrane and ear canal normal.     Mouth/Throat:     Mouth: Mucous membranes are moist.     Pharynx: Oropharynx is clear. Uvula midline. Posterior oropharyngeal erythema present.  Eyes:     Conjunctiva/sclera: Conjunctivae normal.     Pupils: Pupils are equal, round, and reactive to light.  Cardiovascular:     Rate and Rhythm: Normal rate and regular rhythm.     Heart sounds: Normal heart sounds. No murmur heard. Pulmonary:     Effort: Pulmonary effort is normal.     Breath sounds: Normal breath sounds.  Musculoskeletal:     Cervical back: Normal range of motion and neck supple.  Skin:    General: Skin is warm and dry.  Neurological:     General: No focal deficit present.     Mental Status: She is alert and oriented to person, place, and time.      UC Treatments / Results  Labs (all labs ordered are listed, but only abnormal results are displayed) Labs Reviewed - No data to display  EKG   Radiology No results found.  Procedures Procedures (including critical care time)  Medications Ordered in UC Medications - No data to  display  Initial Impression / Assessment and Plan / UC Course  I have reviewed the triage vital signs and  the nursing notes.  Pertinent labs & imaging results that were available during my care of the patient were reviewed by me and considered in my medical decision making (see chart for details).     MDM: 1.  Acute maxillary sinusitis-Rx'd Augmentin-Advised patient to take medication as directed with food to completion.  Encouraged patient to increase daily water intake to 64 ounces per day while taking this medication.  Advised if symptoms worsen and/or unresolved please follow-up with PCP or here for further evaluation.  Patient discharged home, hemodynamically stable. Final Clinical Impressions(s) / UC Diagnoses   Final diagnoses:  Cough, unspecified type  Acute maxillary sinusitis, recurrence not specified     Discharge Instructions      Advised patient to take medication as directed with food to completion.  Encouraged patient to increase daily water intake to 64 ounces per day while taking this medication.  Advised if symptoms worsen and/or unresolved please follow-up with PCP or here for further evaluation.     ED Prescriptions     Medication Sig Dispense Auth. Provider   amoxicillin-clavulanate (AUGMENTIN) 875-125 MG tablet Take 1 tablet by mouth every 12 (twelve) hours. 14 tablet Eliezer Lofts, FNP      PDMP not reviewed this encounter.   Eliezer Lofts, [place] 06/23/22 [phone]

## 2022-07-03 ENCOUNTER — Other Ambulatory Visit (HOSPITAL_COMMUNITY): Payer: Self-pay

## 2022-07-04 ENCOUNTER — Other Ambulatory Visit (HOSPITAL_COMMUNITY): Payer: Self-pay

## 2022-07-25 DIAGNOSIS — H5203 Hypermetropia, bilateral: Secondary | ICD-10-CM | POA: Diagnosis not present

## 2022-07-25 DIAGNOSIS — H52223 Regular astigmatism, bilateral: Secondary | ICD-10-CM | POA: Diagnosis not present

## 2022-07-25 DIAGNOSIS — H31091 Other chorioretinal scars, right eye: Secondary | ICD-10-CM | POA: Diagnosis not present

## 2022-07-25 DIAGNOSIS — H524 Presbyopia: Secondary | ICD-10-CM | POA: Diagnosis not present

## 2022-07-25 DIAGNOSIS — H2513 Age-related nuclear cataract, bilateral: Secondary | ICD-10-CM | POA: Diagnosis not present

## 2022-09-22 ENCOUNTER — Encounter: Payer: Self-pay | Admitting: Physician Assistant

## 2022-09-22 ENCOUNTER — Other Ambulatory Visit (HOSPITAL_COMMUNITY): Payer: Self-pay

## 2022-09-22 ENCOUNTER — Other Ambulatory Visit: Payer: Self-pay

## 2022-09-22 ENCOUNTER — Ambulatory Visit: Payer: Commercial Managed Care - PPO | Admitting: Physician Assistant

## 2022-09-22 VITALS — BP 138/76 | HR 76 | Ht 64.0 in | Wt 209.0 lb

## 2022-09-22 DIAGNOSIS — Z1329 Encounter for screening for other suspected endocrine disorder: Secondary | ICD-10-CM

## 2022-09-22 DIAGNOSIS — E785 Hyperlipidemia, unspecified: Secondary | ICD-10-CM | POA: Diagnosis not present

## 2022-09-22 DIAGNOSIS — R790 Abnormal level of blood mineral: Secondary | ICD-10-CM | POA: Diagnosis not present

## 2022-09-22 DIAGNOSIS — I10 Essential (primary) hypertension: Secondary | ICD-10-CM

## 2022-09-22 DIAGNOSIS — Z131 Encounter for screening for diabetes mellitus: Secondary | ICD-10-CM

## 2022-09-22 DIAGNOSIS — D229 Melanocytic nevi, unspecified: Secondary | ICD-10-CM

## 2022-09-22 DIAGNOSIS — R0981 Nasal congestion: Secondary | ICD-10-CM | POA: Insufficient documentation

## 2022-09-22 DIAGNOSIS — Z780 Asymptomatic menopausal state: Secondary | ICD-10-CM | POA: Diagnosis not present

## 2022-09-22 DIAGNOSIS — R7301 Impaired fasting glucose: Secondary | ICD-10-CM

## 2022-09-22 MED ORDER — FLUTICASONE PROPIONATE 50 MCG/ACT NA SUSP
2.0000 | Freq: Every day | NASAL | 2 refills | Status: AC
  Filled 2022-09-22: qty 16, 30d supply, fill #0

## 2022-09-22 MED ORDER — LOSARTAN POTASSIUM 25 MG PO TABS
25.0000 mg | ORAL_TABLET | Freq: Every day | ORAL | 3 refills | Status: DC
  Filled 2022-09-22: qty 90, 90d supply, fill #0
  Filled 2023-01-06: qty 90, 90d supply, fill #1

## 2022-09-22 NOTE — Patient Instructions (Signed)
Get labs Get mammogram Will make referral to dermatology

## 2022-09-22 NOTE — Progress Notes (Signed)
Established Patient Office Visit  Subjective   Patient ID: Tanya [name], female    DOB: 04/12/1971  Age: 52 y.o. MRN: [id number]  Chief Complaint  Patient presents with   Follow-up    HPI Pt is a 52 yo obese female with HTN, IDA, dyslipidemia who presents to the clinic for follow up. She is doing well. No CP, palpitations, headaches or vision changes. She is taking her medications.   She does have some morning congestion despite taking allegra daily. Admits to pets in her home.   She does have a skin papule on her left side of chin that has been there for at least 1 year. It does not hurt or bleed. It does feel like it is getting a little bigger. It continues to stay scaly no matter what she does to try to remove it. She has never had skin cancer. She is fair skin. She does not wear sunscreen.    .. Active Ambulatory Problems    Diagnosis Date Noted   GERD 07/19/2010   Dermatitis 07/16/2012   Seasonal allergies 07/16/2012   Trigger thumb of left hand 04/17/2014   IDA (iron deficiency anemia) 07/04/2016   Syncope and collapse 09/09/2016   Trigger ring finger of left hand 10/19/2016   Anxiety 10/19/2016   Tinnitus of left ear 11/28/2016   Neck pain on left side 11/28/2016   Meralgia paraesthetica, left 06/05/2017   Episodic paroxysmal hemicrania, not intractable 09/06/2017   Class 1 obesity due to excess calories without serious comorbidity with body mass index (BMI) of 33.0 to 33.9 in adult 09/06/2017   Dyslipidemia (high LDL; low HDL) 09/22/2017   Skin lesion 01/27/2018   Impingement syndrome, shoulder, right 12/21/2019   Trigger thumb, right thumb 09/26/2020   No energy 05/27/2021   Post-menopausal 05/27/2021   Elevated blood pressure reading 05/27/2021   Microcytic anemia 05/28/2021   Low iron stores 06/12/2021   IFG (impaired fasting glucose) 06/12/2021   Family history of heart disease 06/12/2021   Hypertension 06/12/2021   Epidermal cyst of vulva 10/09/2021    Chronic right shoulder pain 12/12/2021   Suspicious nevus 09/22/2022   Nasal congestion 09/22/2022   Resolved Ambulatory Problems    Diagnosis Date Noted   No Resolved Ambulatory Problems   Past Medical History:  Diagnosis Date   GERD (gastroesophageal reflux disease)     ROS See HPI.    Objective:     BP 138/76   Pulse 76   Ht '5\' 4"'$  (1.626 m)   Wt 209 lb (94.8 kg)   LMP  (LMP Unknown)   SpO2 96%   BMI 35.87 kg/m  BP Readings from Last 3 Encounters:  09/22/22 138/76  06/23/22 134/85  03/21/22 129/61   Wt Readings from Last 3 Encounters:  09/22/22 209 lb (94.8 kg)  06/23/22 200 lb (90.7 kg)  03/21/22 203 lb (92.1 kg)   ..    09/22/2022    8:11 AM 03/21/2022    8:12 AM 12/12/2019    9:14 AM 09/02/2017   11:01 AM 06/05/2017    8:56 AM  Depression screen PHQ 2/9  Decreased Interest 0 0 0 0 0  Down, Depressed, Hopeless 0 0 0 0 0  PHQ - 2 Score 0 0 0 0 0  Altered sleeping   0    Tired, decreased energy   0    Change in appetite   0    Feeling bad or failure about yourself    0  Trouble concentrating   0    Moving slowly or fidgety/restless   0    Suicidal thoughts   0    PHQ-9 Score   0    Difficult doing work/chores   Not difficult at all         Physical Exam Constitutional:      Appearance: Normal appearance. She is obese.  HENT:     Head: Normocephalic.  Neck:     Vascular: No carotid bruit.  Cardiovascular:     Rate and Rhythm: Normal rate and regular rhythm.     Pulses: Normal pulses.     Heart sounds: Normal heart sounds.  Pulmonary:     Effort: Pulmonary effort is normal.  Musculoskeletal:     Cervical back: No rigidity or tenderness.     Right lower leg: No edema.     Left lower leg: No edema.  Lymphadenopathy:     Cervical: No cervical adenopathy.  Skin:    Comments: Fair skin Scaly papule on left chin approximately 1cm by 1cm.   Neurological:     General: No focal deficit present.     Mental Status: She is alert and oriented to  person, place, and time.  Psychiatric:        Mood and Affect: Mood normal.      The 10-year ASCVD risk score (Arnett DK, et al., 2019) is: 3.2%    Assessment & Plan:  Marland KitchenMarland KitchenDorean was seen today for follow-up.  Diagnoses and all orders for this visit:  Hypertension, unspecified type  IFG (impaired fasting glucose)  Low iron stores -     CBC with Differential/Platelet  Dyslipidemia (high LDL; low HDL) -     Lipid panel  Thyroid disorder screen -     TSH  Post-menopausal -     VITAMIN D 25 Hydroxy (Vit-D Deficiency, Fractures)  Screening for diabetes mellitus -     Comprehensive metabolic panel  Primary hypertension -     losartan (COZAAR) 25 MG tablet; Take 1 tablet by mouth daily.  Replaces lisinopril  Suspicious nevus -     Ambulatory referral to Dermatology  Nasal congestion -     fluticasone (FLONASE) 50 MCG/ACT nasal spray; Place 2 sprays into both nostrils daily.   BP on 2nd recheck improved Refilled cozaar Cmp ordered today  Fasting labs ordered today  Flonase to add to allegra for nasal congestion  Dermatology made for fair skin and suspicious nevus on chin Reminder to schedule mammogram Follow up in 6 months   Iran Planas, PA-C

## 2022-09-23 LAB — CBC WITH DIFFERENTIAL/PLATELET
Basophils Absolute: 0.1 10*3/uL (ref 0.0–0.2)
Basos: 1 %
EOS (ABSOLUTE): 0.3 10*3/uL (ref 0.0–0.4)
Eos: 5 %
Hematocrit: 39.6 % (ref 34.0–46.6)
Hemoglobin: 12.9 g/dL (ref 11.1–15.9)
Immature Grans (Abs): 0 10*3/uL (ref 0.0–0.1)
Immature Granulocytes: 0 %
Lymphocytes Absolute: 1.5 10*3/uL (ref 0.7–3.1)
Lymphs: 26 %
MCH: 27.4 pg (ref 26.6–33.0)
MCHC: 32.6 g/dL (ref 31.5–35.7)
MCV: 84 fL (ref 79–97)
Monocytes Absolute: 0.4 10*3/uL (ref 0.1–0.9)
Monocytes: 6 %
Neutrophils Absolute: 3.6 10*3/uL (ref 1.4–7.0)
Neutrophils: 62 %
Platelets: 210 10*3/uL (ref 150–450)
RBC: 4.71 x10E6/uL (ref 3.77–5.28)
RDW: 13.2 % (ref 11.7–15.4)
WBC: 5.8 10*3/uL (ref 3.4–10.8)

## 2022-09-23 LAB — COMPREHENSIVE METABOLIC PANEL
ALT: 6 IU/L (ref 0–32)
AST: 23 IU/L (ref 0–40)
Albumin/Globulin Ratio: 2 (ref 1.2–2.2)
Albumin: 4.4 g/dL (ref 3.8–4.9)
Alkaline Phosphatase: 96 IU/L (ref 44–121)
BUN/Creatinine Ratio: 19 (ref 9–23)
BUN: 19 mg/dL (ref 6–24)
Bilirubin Total: 0.4 mg/dL (ref 0.0–1.2)
CO2: 25 mmol/L (ref 20–29)
Calcium: 9.4 mg/dL (ref 8.7–10.2)
Chloride: 103 mmol/L (ref 96–106)
Creatinine, Ser: 1.01 mg/dL — ABNORMAL HIGH (ref 0.57–1.00)
Globulin, Total: 2.2 g/dL (ref 1.5–4.5)
Glucose: 103 mg/dL — ABNORMAL HIGH (ref 70–99)
Potassium: 4.3 mmol/L (ref 3.5–5.2)
Sodium: 142 mmol/L (ref 134–144)
Total Protein: 6.6 g/dL (ref 6.0–8.5)
eGFR: 67 mL/min/{1.73_m2} (ref >59)

## 2022-09-23 LAB — LIPID PANEL
Chol/HDL Ratio: 5.3 ratio — ABNORMAL HIGH (ref 0.0–4.4)
Cholesterol, Total: 227 mg/dL — ABNORMAL HIGH (ref 100–199)
HDL: 43 mg/dL
LDL Chol Calc (NIH): 155 mg/dL — ABNORMAL HIGH (ref 0–99)
Triglycerides: 162 mg/dL — ABNORMAL HIGH (ref 0–149)
VLDL Cholesterol Cal: 29 mg/dL (ref 5–40)

## 2022-09-23 LAB — VITAMIN D 25 HYDROXY (VIT D DEFICIENCY, FRACTURES): Vit D, 25-Hydroxy: 35 ng/mL (ref 30.0–100.0)

## 2022-09-23 LAB — TSH: TSH: 2.15 u[IU]/mL (ref 0.450–4.500)

## 2022-09-23 NOTE — Progress Notes (Signed)
Tanya [name],   WBC looks good.  Hemoglobin looks good.  Vitamin D normal range.  Kidney function above 60 which is good. Serum creatinine about same as 6 months ago.  Fasting glucose up a little. Will get A1C to better evaluate for diabetes.   LDL not to goal at 155.  Overall 10 year risk is under 7.5 percent so you can hold off on statin but risk is increasing so you could go ahead and start. Continue to stay active and low fat diet.   Marland Kitchen.The 10-year ASCVD risk score (Arnett DK, et al., 2019) is: 3.9%   Values used to calculate the score:     Age: 52 years     Sex: Female     Is Non-Hispanic African American: No     Diabetic: No     Tobacco smoker: No     Systolic Blood Pressure: 0000000 mmHg     Is BP treated: Yes     HDL Cholesterol: 43 mg/dL     Total Cholesterol: 227 mg/dL

## 2022-09-24 ENCOUNTER — Encounter: Payer: Self-pay | Admitting: Physician Assistant

## 2022-09-24 DIAGNOSIS — R7303 Prediabetes: Secondary | ICD-10-CM | POA: Insufficient documentation

## 2022-09-24 DIAGNOSIS — Z5320 Procedure and treatment not carried out because of patient's decision for unspecified reasons: Secondary | ICD-10-CM | POA: Insufficient documentation

## 2022-09-24 NOTE — Progress Notes (Signed)
Marvelous,   Pre-diabetes with A1C at 5.9.  Diet changes, exercise can help. We could start metformin which can help with insulin resistance and weight loss. Thoughts? Recheck A1c in 6 months.

## 2022-09-25 LAB — HGB A1C W/O EAG: Hgb A1c MFr Bld: 5.9 % — ABNORMAL HIGH (ref 4.8–5.6)

## 2022-09-25 LAB — SPECIMEN STATUS REPORT

## 2022-10-09 DIAGNOSIS — Z1231 Encounter for screening mammogram for malignant neoplasm of breast: Secondary | ICD-10-CM | POA: Diagnosis not present

## 2022-10-09 DIAGNOSIS — Z1239 Encounter for other screening for malignant neoplasm of breast: Secondary | ICD-10-CM | POA: Diagnosis not present

## 2022-11-03 ENCOUNTER — Ambulatory Visit: Payer: Commercial Managed Care - PPO | Admitting: Dermatology

## 2022-11-03 DIAGNOSIS — L814 Other melanin hyperpigmentation: Secondary | ICD-10-CM | POA: Diagnosis not present

## 2022-11-03 DIAGNOSIS — L570 Actinic keratosis: Secondary | ICD-10-CM

## 2022-11-03 DIAGNOSIS — L918 Other hypertrophic disorders of the skin: Secondary | ICD-10-CM

## 2022-11-03 DIAGNOSIS — L578 Other skin changes due to chronic exposure to nonionizing radiation: Secondary | ICD-10-CM | POA: Diagnosis not present

## 2022-11-03 DIAGNOSIS — D229 Melanocytic nevi, unspecified: Secondary | ICD-10-CM

## 2022-11-03 DIAGNOSIS — W908XXA Exposure to other nonionizing radiation, initial encounter: Secondary | ICD-10-CM

## 2022-11-03 DIAGNOSIS — Z1283 Encounter for screening for malignant neoplasm of skin: Secondary | ICD-10-CM

## 2022-11-03 DIAGNOSIS — L821 Other seborrheic keratosis: Secondary | ICD-10-CM

## 2022-11-03 DIAGNOSIS — D1801 Hemangioma of skin and subcutaneous tissue: Secondary | ICD-10-CM | POA: Diagnosis not present

## 2022-11-03 DIAGNOSIS — X32XXXA Exposure to sunlight, initial encounter: Secondary | ICD-10-CM

## 2022-11-03 NOTE — Patient Instructions (Signed)
Cryotherapy Aftercare  Wash gently with soap and water everyday.   Apply Vaseline and Band-Aid daily until healed.   Due to recent changes in healthcare laws, you may see results of your pathology and/or laboratory studies on MyChart before the doctors have had a chance to review them. We understand that in some cases there may be results that are confusing or concerning to you. Please understand that not all results are received at the same time and often the doctors may need to interpret multiple results in order to provide you with the best plan of care or course of treatment. Therefore, we ask that you please give us 2 business days to thoroughly review all your results before contacting the office for clarification. Should we see a critical lab result, you will be contacted sooner.   If You Need Anything After Your Visit  If you have any questions or concerns for your doctor, please call our main line at [phone] If no one answers, please leave a voicemail as directed and we will return your call as soon as possible. Messages left after 4 pm will be answered the following business day.   You may also send us a message via MyChart. We typically respond to MyChart messages within 1-2 business days.  For prescription refills, please ask your pharmacy to contact our office. Our fax number is [phone].  If you have an urgent issue when the clinic is closed that cannot wait until the next business day, you can page your doctor at the number below.    Please note that while we do our best to be available for urgent issues outside of office hours, we are not available 24/7.   If you have an urgent issue and are unable to reach us, you may choose to seek medical care at your doctor's office, retail clinic, urgent care center, or emergency room.  If you have a medical emergency, please immediately call 911 or go to the emergency department. In the event of inclement weather, please call our  main line at [phone] for an update on the status of any delays or closures.  Dermatology Medication Tips: Please keep the boxes that topical medications come in in order to help keep track of the instructions about where and how to use these. Pharmacies typically print the medication instructions only on the boxes and not directly on the medication tubes.   If your medication is too expensive, please contact our office at [phone] or send us a message through MyChart.   We are unable to tell what your co-pay for medications will be in advance as this is different depending on your insurance coverage. However, we may be able to find a substitute medication at lower cost or fill out paperwork to get insurance to cover a needed medication.   If a prior authorization is required to get your medication covered by your insurance company, please allow us 1-2 business days to complete this process.  Drug prices often vary depending on where the prescription is filled and some pharmacies may offer cheaper prices.  The website www.goodrx.com contains coupons for medications through different pharmacies. The prices here do not account for what the cost may be with help from insurance (it may be cheaper with your insurance), but the website can give you the price if you did not use any insurance.  - You can print the associated coupon and take it with your prescription to the pharmacy.  - You may also   stop by our office during regular business hours and pick up a GoodRx coupon card.  - If you need your prescription sent electronically to a different pharmacy, notify our office through [place] MyChart or by phone at [phone]     

## 2022-11-03 NOTE — Progress Notes (Signed)
   New Patient Visit   Subjective  Tanya [name] is a 52 y.o. female who presents for the following: Skin Cancer Screening and Full Body Skin Exam  The patient presents for Total-Body Skin Exam (TBSE) for skin cancer screening and mole check. The patient has spots, moles and lesions to be evaluated, some may be new or changing and the patient has concerns that these could be cancer.  Patient is here for a full body skin check. Has never had skin checked before. No family hx of skin cancer and no personal hx of skin cancer or abnormal nevi.   The following portions of the chart were reviewed this encounter and updated as appropriate: medications, allergies, medical history  Review of Systems:  No other skin or systemic complaints except as noted in HPI or Assessment and Plan.  Objective  Well appearing patient in no apparent distress; mood and affect are within normal limits.  A full examination was performed including scalp, head, eyes, ears, nose, lips, neck, chest, axillae, abdomen, back, buttocks, bilateral upper extremities, bilateral lower extremities, hands, feet, fingers, toes, fingernails, and toenails. All findings within normal limits unless otherwise noted below.   Relevant physical exam findings are noted in the Assessment and Plan.  Head - Anterior (Face) Erythematous thin papules/macules with gritty scale.     Assessment & Plan   LENTIGINES, SEBORRHEIC KERATOSES, HEMANGIOMAS - Benign normal skin lesions - Benign-appearing - Call for any changes  MELANOCYTIC NEVI - Tan-brown and/or pink-flesh-colored symmetric macules and papules - Benign appearing on exam today - Observation - Call clinic for new or changing moles - Recommend daily use of broad spectrum spf 30+ sunscreen to sun-exposed areas.   ACTINIC DAMAGE - Chronic condition, secondary to cumulative UV/sun exposure - diffuse scaly erythematous macules with underlying dyspigmentation - Recommend daily broad  spectrum sunscreen SPF 30+ to sun-exposed areas, reapply every 2 hours as needed.  - Staying in the shade or wearing long sleeves, sun glasses (UVA+UVB protection) and wide brim hats (4-inch brim around the entire circumference of the hat) are also recommended for sun protection.  - Call for new or changing lesions.  SKIN CANCER SCREENING PERFORMED TODAY.    AK (actinic keratosis) Head - Anterior (Face)  Destruction of lesion - Head - Anterior (Face) Complexity: simple   Destruction method: cryotherapy   Informed consent: discussed and consent obtained   Timeout:  patient name, date of birth, surgical site, and procedure verified Lesion destroyed using liquid nitrogen: Yes   Region frozen until ice ball extended beyond lesion: Yes   Outcome: patient tolerated procedure well with no complications   Post-procedure details: wound care instructions given      No follow-ups on file.    Documentation: I have reviewed the above documentation for accuracy and completeness, and I agree with the above.  Langston Reusing, MD  I, Germaine Pomfret, CMA, am acting as scribe for Langston Reusing, MD.

## 2022-12-10 ENCOUNTER — Encounter: Payer: Self-pay | Admitting: Dermatology

## 2023-01-06 ENCOUNTER — Other Ambulatory Visit (HOSPITAL_COMMUNITY): Payer: Self-pay

## 2023-02-24 ENCOUNTER — Encounter: Payer: Self-pay | Admitting: Medical-Surgical

## 2023-02-24 ENCOUNTER — Ambulatory Visit: Payer: Commercial Managed Care - PPO | Admitting: Medical-Surgical

## 2023-02-24 VITALS — BP 134/73 | HR 68 | Ht 64.0 in | Wt 208.5 lb

## 2023-02-24 DIAGNOSIS — M533 Sacrococcygeal disorders, not elsewhere classified: Secondary | ICD-10-CM

## 2023-02-24 MED ORDER — METHYLPREDNISOLONE 4 MG PO TBPK: ORAL_TABLET | ORAL | 0 refills | Status: DC

## 2023-02-24 MED ORDER — DICLOFENAC SODIUM 75 MG PO TBEC: 75.0000 mg | DELAYED_RELEASE_TABLET | Freq: Two times a day (BID) | ORAL | 0 refills | Status: DC

## 2023-02-24 NOTE — Progress Notes (Signed)
        Established patient visit  History, exam, impression, and plan:  1. Sacroiliac joint dysfunction of right side Very pleasant 52 year old female presenting today with reports of right lower back pain that started approximately 1 week ago.  Notes that she does like exercises in the evenings and wonders if she pulled something.  The pain is worse in the morning and improves throughout the day however yesterday it bothered her most of the day.  History of left-sided sciatica but this has not been bothering her lately.  On the right side, notes the pain is dull and aching however there are periods of a grabbing sensation.  Denies numbness, weakness, and tingling in the lower extremities.  No new onset of urinary/bowel incontinence.  No saddle paresthesias.  No limited range of motion and able to ambulate.  Denies right-sided sciatica.  On exam, point tenderness noted at the right SI joint.  Negative straight leg raise test bilaterally.  Negative Faber and Fadir bilaterally.  Plan to treat with Medrol Dosepak for 6-day taper.  After that start Voltaren 75 mg twice daily as needed.  SI joint dysfunction exercises printed and provided for home completion.  If no improvement in 4-6 weeks of conservative measures, return for further evaluation.  Procedures performed this visit: None.  Return if symptoms worsen or fail to improve.  __________________________________ Thayer Ohm, DNP, APRN, FNP-BC Primary Care and Sports Medicine [facility] [place]

## 2023-03-23 ENCOUNTER — Other Ambulatory Visit (HOSPITAL_COMMUNITY): Payer: Self-pay

## 2023-03-23 ENCOUNTER — Ambulatory Visit: Payer: Commercial Managed Care - PPO | Admitting: Physician Assistant

## 2023-03-23 ENCOUNTER — Encounter: Payer: Self-pay | Admitting: Physician Assistant

## 2023-03-23 ENCOUNTER — Other Ambulatory Visit (HOSPITAL_COMMUNITY)
Admission: RE | Admit: 2023-03-23 | Discharge: 2023-03-23 | Disposition: A | Discharge status: Home / Self Care | Payer: Commercial Managed Care - PPO | Source: Ambulatory Visit | Attending: Physician Assistant | Admitting: Physician Assistant

## 2023-03-23 VITALS — BP 138/68 | HR 66 | Ht 64.0 in | Wt 206.8 lb

## 2023-03-23 DIAGNOSIS — Z0000 Encounter for general adult medical examination without abnormal findings: Secondary | ICD-10-CM

## 2023-03-23 DIAGNOSIS — Z131 Encounter for screening for diabetes mellitus: Secondary | ICD-10-CM | POA: Diagnosis not present

## 2023-03-23 DIAGNOSIS — I10 Essential (primary) hypertension: Secondary | ICD-10-CM

## 2023-03-23 DIAGNOSIS — Z6835 Body mass index (BMI) 35.0-35.9, adult: Secondary | ICD-10-CM | POA: Diagnosis not present

## 2023-03-23 DIAGNOSIS — Z124 Encounter for screening for malignant neoplasm of cervix: Secondary | ICD-10-CM

## 2023-03-23 DIAGNOSIS — E6609 Other obesity due to excess calories: Secondary | ICD-10-CM

## 2023-03-23 DIAGNOSIS — R790 Abnormal level of blood mineral: Secondary | ICD-10-CM | POA: Diagnosis not present

## 2023-03-23 DIAGNOSIS — R7301 Impaired fasting glucose: Secondary | ICD-10-CM | POA: Diagnosis not present

## 2023-03-23 DIAGNOSIS — E785 Hyperlipidemia, unspecified: Secondary | ICD-10-CM | POA: Diagnosis not present

## 2023-03-23 MED ORDER — LOSARTAN POTASSIUM 50 MG PO TABS
50.0000 mg | ORAL_TABLET | Freq: Every day | ORAL | 3 refills | Status: DC
Start: 2023-03-23 — End: 2024-04-04
  Filled 2023-03-23 – 2023-04-06 (×2): qty 90, 90d supply, fill #0
  Filled 2023-07-07: qty 90, 90d supply, fill #1
  Filled 2023-09-29: qty 90, 90d supply, fill #2
  Filled 2024-01-06: qty 90, 90d supply, fill #3

## 2023-03-23 NOTE — Patient Instructions (Signed)

## 2023-03-23 NOTE — Progress Notes (Unsigned)
Complete physical exam  Patient: Tanya [name]   DOB: Feb 25, 1971   52 y.o. Female  MRN: [id number]  Subjective:    Chief Complaint  Patient presents with   Annual Exam    Including PAP=kph    Tanya [name] is a 52 y.o. female who presents today for a complete physical exam. She reports consuming a {diet types:17450} diet. {types:19826} She generally feels {DESC; WELL/FAIRLY WELL/POORLY:18703}. She reports sleeping {DESC; WELL/FAIRLY WELL/POORLY:18703}. She {does/does not:200015} have additional problems to discuss today.    Most recent fall risk assessment:    03/23/2023    9:04 AM  Fall Risk   Falls in the past year? 0  Number falls in past yr: 0  Injury with Fall? 0  Risk for fall due to : No Fall Risks  Follow up Falls evaluation completed     Most recent depression screenings:    03/23/2023    9:05 AM 02/24/2023    8:55 AM  PHQ 2/9 Scores  PHQ - 2 Score 0 0    {VISON DENTAL STD PSA (Optional):27386}  {History (Optional):23778}  Patient Care Team: Nolene Ebbs as PCP - General (Family Medicine)   Outpatient Medications Prior to Visit  Medication Sig   b complex vitamins capsule Take 1 capsule by mouth daily.   CALCIUM PO Take by mouth in the morning and at bedtime.   diclofenac (VOLTAREN) 75 MG EC tablet Take 1 tablet (75 mg total) by mouth 2 (two) times daily.   Ferrous Sulfate (IRON PO) Take by mouth daily.   fluticasone (FLONASE) 50 MCG/ACT nasal spray Place 2 sprays into both nostrils daily.   Misc Natural Products (OSTEO BI-FLEX TRIPLE STRENGTH PO) Take by mouth in the morning and at bedtime.   Multiple Vitamin tablet Take 2 tablets by mouth daily.   Multiple Vitamins-Minerals (ZINC PO) Take by mouth.   naproxen sodium (ALEVE) 220 MG tablet Take 220 mg by mouth daily as needed.   omeprazole (PRILOSEC OTC) 20 MG tablet Take 20 mg by mouth daily.   OVER THE COUNTER MEDICATION Thera-worx roll on   [DISCONTINUED] losartan (COZAAR) 25 MG tablet Take 1  tablet by mouth daily.  Replaces lisinopril   fexofenadine (ALLEGRA ALLERGY) 180 MG tablet Take 1 tablet (180 mg total) by mouth daily for 15 days.   [DISCONTINUED] calcium carbonate (TUMS EX) 750 MG chewable tablet Chew 1 tablet by mouth as needed. Takes 3-4 tablets (Patient not taking: Reported on 03/23/2023)   [DISCONTINUED] methylPREDNISolone (MEDROL DOSEPAK) 4 MG TBPK tablet 6-day pack as directed (Patient not taking: Reported on 03/23/2023)   Facility-Administered Medications Prior to Visit  Medication Dose Route Frequency Provider   gadopentetate dimeglumine (MAGNEVIST) injection 17 mL  17 mL Intravenous Once PRN York Spaniel, MD    ROS  See HPI.     Objective:     BP 138/68   Pulse 66   Ht 5\' 4"  (1.626 m)   Wt 206 lb 12 oz (93.8 kg)   LMP  (LMP Unknown)   SpO2 96%   BMI 35.49 kg/m  BP Readings from Last 3 Encounters:  03/23/23 138/68  02/24/23 134/73  09/22/22 138/76   Wt Readings from Last 3 Encounters:  03/23/23 206 lb 12 oz (93.8 kg)  02/24/23 208 lb 8 oz (94.6 kg)  09/22/22 209 lb (94.8 kg)      Physical Exam       Assessment & Plan:    Routine Health Maintenance and  Physical Exam  Immunization History  Administered Date(s) Administered   Influenza, Seasonal, Injecte, Preservative Fre 04/25/2019   Influenza-Unspecified 04/29/2016, 04/10/2017, 04/10/2018, 05/01/2021, 04/22/2022   PFIZER(Purple Top)SARS-COV-2 Vaccination 07/18/2019, 08/06/2019, 09/02/2020   PPD Test 04/20/2018   Tdap 11/28/2016    Health Maintenance  Topic Date Due   PAP SMEAR-Modifier  09/02/2020   COVID-19 Vaccine (4 - 2023-24 season) 04/08/2023 (Originally 03/28/2022)   Zoster Vaccines- Shingrix (1 of 2) 05/27/2023 (Originally 10/06/1987)   MAMMOGRAM  09/23/2023 (Originally 08/08/2022)   INFLUENZA VACCINE  10/26/2023 (Originally 02/26/2023)   HIV Screening  07/03/2026 (Originally 10/06/1983)   Fecal DNA (Cologuard)  06/09/2024   DTaP/Tdap/Td (2 - Td or Tdap) 11/29/2026    Hepatitis C Screening  Completed   HPV VACCINES  Aged Out    Discussed health benefits of physical activity, and encouraged her to engage in regular exercise appropriate for her age and condition. Tanya KitchenAlbin Felling was seen today for annual exam.  Diagnoses and all orders for this visit:  Routine physical examination -     HgB A1c -     CMP14+EGFR -     Lipid panel -     CBC w/Diff/Platelet -     TSH -     Cytology - PAP  Screening for diabetes mellitus -     HgB A1c -     CMP14+EGFR  Primary hypertension -     losartan (COZAAR) 50 MG tablet; Take 1 tablet (50 mg total) by mouth daily.  Routine Papanicolaou smear -     Cytology - PAP       Tandy Gaw, PA-C

## 2023-03-24 LAB — CMP14+EGFR
ALT: 16 IU/L (ref 0–32)
AST: 26 IU/L (ref 0–40)
Albumin: 4.3 g/dL (ref 3.8–4.9)
Alkaline Phosphatase: 76 IU/L (ref 44–121)
BUN/Creatinine Ratio: 17 (ref 9–23)
BUN: 17 mg/dL (ref 6–24)
Bilirubin Total: 0.4 mg/dL (ref 0.0–1.2)
CO2: 24 mmol/L (ref 20–29)
Calcium: 9.6 mg/dL (ref 8.7–10.2)
Chloride: 102 mmol/L (ref 96–106)
Creatinine, Ser: 1.02 mg/dL — ABNORMAL HIGH (ref 0.57–1.00)
Globulin, Total: 2.1 g/dL (ref 1.5–4.5)
Glucose: 96 mg/dL (ref 70–99)
Potassium: 4.4 mmol/L (ref 3.5–5.2)
Sodium: 140 mmol/L (ref 134–144)
Total Protein: 6.4 g/dL (ref 6.0–8.5)
eGFR: 65 mL/min/{1.73_m2} (ref >59)

## 2023-03-24 LAB — LIPID PANEL
Chol/HDL Ratio: 5 ratio — ABNORMAL HIGH (ref 0.0–4.4)
Cholesterol, Total: 227 mg/dL — ABNORMAL HIGH (ref 100–199)
HDL: 45 mg/dL (ref >39)
LDL Chol Calc (NIH): 149 mg/dL — ABNORMAL HIGH (ref 0–99)
Triglycerides: 180 mg/dL — ABNORMAL HIGH (ref 0–149)
VLDL Cholesterol Cal: 33 mg/dL (ref 5–40)

## 2023-03-24 LAB — CBC WITH DIFFERENTIAL/PLATELET
Basophils Absolute: 0.1 10*3/uL (ref 0.0–0.2)
Basos: 1 %
EOS (ABSOLUTE): 0.3 10*3/uL (ref 0.0–0.4)
Eos: 6 %
Hematocrit: 40.8 % (ref 34.0–46.6)
Hemoglobin: 13.5 g/dL (ref 11.1–15.9)
Immature Grans (Abs): 0 10*3/uL (ref 0.0–0.1)
Immature Granulocytes: 0 %
Lymphocytes Absolute: 1.5 10*3/uL (ref 0.7–3.1)
Lymphs: 27 %
MCH: 28.4 pg (ref 26.6–33.0)
MCHC: 33.1 g/dL (ref 31.5–35.7)
MCV: 86 fL (ref 79–97)
Monocytes Absolute: 0.4 10*3/uL (ref 0.1–0.9)
Monocytes: 7 %
Neutrophils Absolute: 3.2 10*3/uL (ref 1.4–7.0)
Neutrophils: 59 %
Platelets: 213 10*3/uL (ref 150–450)
RBC: 4.75 x10E6/uL (ref 3.77–5.28)
RDW: 13.4 % (ref 11.7–15.4)
WBC: 5.4 10*3/uL (ref 3.4–10.8)

## 2023-03-24 LAB — HEMOGLOBIN A1C
Est. average glucose Bld gHb Est-mCnc: 123 mg/dL
Hgb A1c MFr Bld: 5.9 % — ABNORMAL HIGH (ref 4.8–5.6)

## 2023-03-24 LAB — TSH: TSH: 1.97 u[IU]/mL (ref 0.450–4.500)

## 2023-03-24 NOTE — Progress Notes (Signed)
Azaliyah,   A1C stayed the same and in pre-diabetes range. Goal to increase exercise and limit sugars/carbs. Recheck in 6 months.  Kidney function stayed the same with GFR above 60.  Thyroid looks great.  WBC and hemoglobin look great.   LDL not to goal. 10 year CV risk is 4 percent. Will keep monitoring risk above 7.5 suggest statin therapy. Avoiding fried/fatty and processed foods.   Marland Kitchen.The 10-year ASCVD risk score (Arnett DK, et al., 2019) is: 4%   Values used to calculate the score:     Age: 35 years     Sex: Female     Is Non-Hispanic African American: No     Diabetic: No     Tobacco smoker: No     Systolic Blood Pressure: 138 mmHg     Is BP treated: Yes     HDL Cholesterol: 45 mg/dL     Total Cholesterol: 227 mg/dL

## 2023-03-25 LAB — CYTOLOGY - PAP
Comment: NEGATIVE
Diagnosis: NEGATIVE
High risk HPV: NEGATIVE

## 2023-03-25 NOTE — Progress Notes (Signed)
Next pap 5 years.  Negative for HPV and no abnormal cells.  GREAT news.

## 2023-04-03 ENCOUNTER — Other Ambulatory Visit (HOSPITAL_COMMUNITY): Payer: Self-pay

## 2023-04-06 ENCOUNTER — Other Ambulatory Visit (HOSPITAL_COMMUNITY): Payer: Self-pay

## 2023-04-13 ENCOUNTER — Encounter: Payer: Self-pay | Admitting: Physician Assistant

## 2023-04-16 ENCOUNTER — Encounter: Payer: Self-pay | Admitting: Physician Assistant

## 2023-04-27 ENCOUNTER — Encounter: Payer: Self-pay | Admitting: Physician Assistant

## 2023-06-17 ENCOUNTER — Other Ambulatory Visit: Payer: Self-pay

## 2023-06-17 ENCOUNTER — Ambulatory Visit
Admission: EM | Admit: 2023-06-17 | Discharge: 2023-06-17 | Disposition: A | Discharge status: Home / Self Care | Payer: Commercial Managed Care - PPO

## 2023-06-17 DIAGNOSIS — R059 Cough, unspecified: Secondary | ICD-10-CM | POA: Diagnosis not present

## 2023-06-17 DIAGNOSIS — J069 Acute upper respiratory infection, unspecified: Secondary | ICD-10-CM

## 2023-06-17 MED ORDER — PREDNISONE 20 MG PO TABS: ORAL_TABLET | ORAL | 0 refills | Status: DC

## 2023-06-17 MED ORDER — AZITHROMYCIN 250 MG PO TABS: 250.0000 mg | ORAL_TABLET | Freq: Every day | ORAL | 0 refills | Status: DC

## 2023-06-17 NOTE — Discharge Instructions (Addendum)
Advised patient to take medications as directed with food to completion.  Encouraged to increase daily water intake to 64 ounces per day while taking these medications.  Advised if symptoms worsen and/or unresolved please follow-up with PCP or here for further evaluation.

## 2023-06-17 NOTE — ED Provider Notes (Signed)
[facility]    CSN: [id number] Arrival date & time: 06/17/23  1712      History   Chief Complaint Chief Complaint  Patient presents with   Cough    HPI Tanya [name] is a 52 y.o. female.   HPI 52 year old female presents with cough and throat irritation for 2 days.  Patient reports visiting father-in-law who has been diagnosed with rhinovirus and pneumonia.  PMH significant for obesity, HTN, and GERD.  Past Medical History:  Diagnosis Date   GERD (gastroesophageal reflux disease)    Hypertension    Syncope and collapse 09/09/2016    Patient Active Problem List   Diagnosis Date Noted   Pre-diabetes 09/24/2022   Statin declined 09/24/2022   Suspicious nevus 09/22/2022   Nasal congestion 09/22/2022   Chronic right shoulder pain 12/12/2021   Epidermal cyst of vulva 10/09/2021   Low iron stores 06/12/2021   IFG (impaired fasting glucose) 06/12/2021   Family history of heart disease 06/12/2021   Hypertension 06/12/2021   Microcytic anemia 05/28/2021   No energy 05/27/2021   Post-menopausal 05/27/2021   Elevated blood pressure reading 05/27/2021   Trigger thumb, right thumb 09/26/2020   Impingement syndrome, shoulder, right 12/21/2019   Skin lesion 01/27/2018   Dyslipidemia (high LDL; low HDL) 09/22/2017   Episodic paroxysmal hemicrania, not intractable 09/06/2017   Class 2 obesity due to excess calories without serious comorbidity with body mass index (BMI) of 35.0 to 35.9 in adult 09/06/2017   Meralgia paraesthetica, left 06/05/2017   Tinnitus of left ear 11/28/2016   Neck pain on left side 11/28/2016   Trigger ring finger of left hand 10/19/2016   Anxiety 10/19/2016   Syncope and collapse 09/09/2016   IDA (iron deficiency anemia) 07/04/2016   Trigger thumb of left hand 04/17/2014   Dermatitis 07/16/2012   Seasonal allergies 07/16/2012   GERD 07/19/2010    Past Surgical History:  Procedure Laterality Date   FINGER SURGERY     TENDON REPAIR      RT 4th digit   tri mallear      OB History   No obstetric history on file.      Home Medications    Prior to Admission medications   Medication Sig Start Date End Date Taking? Authorizing Provider  azithromycin (ZITHROMAX) 250 MG tablet Take 1 tablet (250 mg total) by mouth daily. Take first 2 tablets together, then 1 every day until finished. 06/17/23  Yes Trevor Iha, FNP  cholecalciferol (VITAMIN D3) 25 MCG (1000 UNIT) tablet Take 1,000 Units by mouth daily.   Yes [provider]  predniSONE (DELTASONE) 20 MG tablet Take 3 tabs PO daily x 5 days. 06/17/23  Yes Trevor Iha, FNP  vitamin E 200 UNIT capsule Take 200 Units by mouth daily.   Yes [provider]  b complex vitamins capsule Take 1 capsule by mouth daily.    [provider]  CALCIUM PO Take by mouth in the morning and at bedtime.    [provider]  Ferrous Sulfate (IRON PO) Take by mouth daily.    [provider]  fexofenadine (ALLEGRA ALLERGY) 180 MG tablet Take 1 tablet (180 mg total) by mouth daily for 15 days. 12/28/21 01/24/22  Trevor Iha, FNP  fluticasone (FLONASE) 50 MCG/ACT nasal spray Place 2 sprays into both nostrils daily. 09/22/22   Breeback, Lesly Rubenstein L, PA-C  losartan (COZAAR) 50 MG tablet Take 1 tablet (50 mg total) by mouth daily. 03/23/23   Breeback,  Jade L, PA-C  Multiple Vitamin tablet Take 2 tablets by mouth daily.    [provider]  Multiple Vitamins-Minerals (ZINC PO) Take by mouth.    [provider]  naproxen sodium (ALEVE) 220 MG tablet Take 220 mg by mouth daily as needed.    [provider]  omeprazole (PRILOSEC OTC) 20 MG tablet Take 20 mg by mouth daily.    [provider]  OVER THE COUNTER MEDICATION Thera-worx roll on    [provider]    Family History Family History  Problem Relation Age of Onset   Healthy Mother    Cancer Father        prostate   Heart attack Maternal Grandmother     Hypertension Maternal Grandmother    Parkinson's disease Maternal Grandfather     Social History Social History   Tobacco Use   Smoking status: Never   Smokeless tobacco: Never  Vaping Use   Vaping status: Never Used  Substance Use Topics   Alcohol use: Not Currently   Drug use: No     Allergies   Other   Review of Systems Review of Systems  HENT:  Positive for sore throat.   Respiratory:  Positive for cough.   All other systems reviewed and are negative.    Physical Exam Triage Vital Signs ED Triage Vitals  Encounter Vitals Group     BP 06/17/23 1717 (!) 145/85     Systolic BP Percentile --      Diastolic BP Percentile --      Pulse Rate 06/17/23 1717 79     Resp 06/17/23 1717 16     Temp 06/17/23 1717 98.7 F (37.1 C)     Temp Source 06/17/23 1717 Oral     SpO2 06/17/23 1717 99 %     Weight --      Height --      Head Circumference --      Peak Flow --      Pain Score 06/17/23 1722 0     Pain Loc --      Pain Education --      Exclude from Growth Chart --    No data found.  Updated Vital Signs BP (!) 145/85   Pulse 79   Temp 98.7 F (37.1 C) (Oral)   Resp 16   LMP  (LMP Unknown)   SpO2 99%    Physical Exam Vitals and nursing note reviewed.  Constitutional:      Appearance: Normal appearance. She is obese.  HENT:     Head: Normocephalic and atraumatic.     Right Ear: Tympanic membrane, ear canal and external ear normal.     Left Ear: Tympanic membrane, ear canal and external ear normal.     Mouth/Throat:     Mouth: Mucous membranes are moist.     Pharynx: Oropharynx is clear. Uvula midline. Posterior oropharyngeal erythema and uvula swelling present.  Eyes:     Extraocular Movements: Extraocular movements intact.     Conjunctiva/sclera: Conjunctivae normal.     Pupils: Pupils are equal, round, and reactive to light.  Cardiovascular:     Rate and Rhythm: Normal rate and regular rhythm.     Pulses: Normal pulses.     Heart sounds:  Normal heart sounds.  Pulmonary:     Effort: Pulmonary effort is normal.     Breath sounds: Normal breath sounds. No wheezing, rhonchi or rales.     Comments: Infrequent nonproductive cough  noted on exam Musculoskeletal:        General: Normal range of motion.     Cervical back: Normal range of motion and neck supple.  Skin:    General: Skin is warm and dry.  Neurological:     General: No focal deficit present.     Mental Status: She is alert and oriented to person, place, and time. Mental status is at baseline.  Psychiatric:        Mood and Affect: Mood normal.        Behavior: Behavior normal.      UC Treatments / Results  Labs (all labs ordered are listed, but only abnormal results are displayed) Labs Reviewed - No data to display  EKG   Radiology No results found.  Procedures Procedures (including critical care time)  Medications Ordered in UC Medications - No data to display  Initial Impression / Assessment and Plan / UC Course  I have reviewed the triage vital signs and the nursing notes.  Pertinent labs & imaging results that were available during my care of the patient were reviewed by me and considered in my medical decision making (see chart for details).     MDM: 1.  Acute URI-Rx'd Zithromax (500 mg day 1, then 250 mg day 2-5); 2.  Cough, unspecified type-Rx'd prednisone 20 mg tablet take 3 tabs p.o. daily x 5 days. Advised patient to take medications as directed with food to completion.  Encouraged to increase daily water intake to 64 ounces per day while taking these medications.  Advised if symptoms worsen and/or unresolved please follow-up with PCP or here for further evaluation.  Patient discharged home, hemodynamically stable. Final Clinical Impressions(s) / UC Diagnoses   Final diagnoses:  Cough, unspecified type  Acute URI     Discharge Instructions      Advised patient to take medications as directed with food to completion.  Encouraged to  increase daily water intake to 64 ounces per day while taking these medications.  Advised if symptoms worsen and/or unresolved please follow-up with PCP or here for further evaluation.     ED Prescriptions     Medication Sig Dispense Auth. Provider   azithromycin (ZITHROMAX) 250 MG tablet Take 1 tablet (250 mg total) by mouth daily. Take first 2 tablets together, then 1 every day until finished. 6 tablet Trevor Iha, FNP   predniSONE (DELTASONE) 20 MG tablet Take 3 tabs PO daily x 5 days. 15 tablet Trevor Iha, FNP      PDMP not reviewed this encounter.   Trevor Iha, FNP 06/17/23 1757

## 2023-06-17 NOTE — ED Triage Notes (Signed)
C/o cough and throat irritation since Monday. Was in assisted living facility visiting father-in-law who has rhinovirus and pneumonia now. Has been using nyquil and saline spray without relief.

## 2023-07-07 ENCOUNTER — Other Ambulatory Visit (HOSPITAL_COMMUNITY): Payer: Self-pay

## 2023-07-28 ENCOUNTER — Ambulatory Visit: Payer: Commercial Managed Care - PPO | Admitting: Physician Assistant

## 2023-07-28 ENCOUNTER — Encounter: Payer: Self-pay | Admitting: Physician Assistant

## 2023-07-28 VITALS — BP 117/78 | HR 77 | Resp 14 | Ht 64.0 in | Wt 215.4 lb

## 2023-07-28 DIAGNOSIS — L409 Psoriasis, unspecified: Secondary | ICD-10-CM | POA: Diagnosis not present

## 2023-07-28 MED ORDER — CLOBETASOL PROPIONATE 0.05 % EX SOLN: 1.0000 | Freq: Two times a day (BID) | CUTANEOUS | 1 refills | Status: AC

## 2023-07-28 NOTE — Patient Instructions (Signed)
 Salicylic Acid Shampoo What is this medication? SALICYCLIC ACID (SAL i SIL ik AS id) reduces swelling, redness, itching, or rashes caused by skin conditions, such as psoriasis. It may also be used to treat seborrheic dermatitis, a condition that causes dry, flaky, and itchy skin. It works by reducing redness, irritation, and scaling on the skin. This medicine may be used for other purposes; ask your health care provider or pharmacist if you have questions. COMMON BRAND NAME(S): Aliclen, Dermarest Psoriasis Shampoo plus Conditioner, DHS, Ionil, Ionil Plus, Keralyt, Keralyt 5, Meted, Neutrogena T/Sal Scalp, P&S, Salex, Sebex, Sebulex, Selsun Blue, Thera-Sal What should I tell my care team before I take this medication? They need to know if you have any of these conditions: Kidney disease Large areas of burned or damaged skin Liver disease An unusual or allergic reaction to salicylic acid, other medications, foods, dyes, or preservatives Pregnant or trying to get pregnant Breast-feeding How should I use this medication? This medication is for external use only. Do not take by mouth. Wash hands before and after use. Do not get this medication in your eyes. If you do, rinse them out with plenty of cool tap water. Use this medication as directed on the prescription label at the same time every day. Do not use it more often than directed. Keep taking it unless your care team tells you to stop. Shake well before using. Apply medication to wet hair. Massage into the hair and scalp working it into a full lather. Rinse thoroughly. You do not need to use regular shampoo after each application. Talk to your care team about the use of this medication in children. While it may be prescribed for children as young as 2 years for selected conditions, precautions do apply. Overdosage: If you think you have taken too much of this medicine contact a poison control center or emergency room at once. NOTE: This medicine  is only for you. Do not share this medicine with others. What if I miss a dose? If you miss a dose, use it as soon as you can. If it is almost time for your next dose, use only that dose. Do not use double or extra doses. What may interact with this medication? Interactions are not expected. Do not use any other skin products without telling your care team. This list may not describe all possible interactions. Give your health care provider a list of all the medicines, herbs, non-prescription drugs, or dietary supplements you use. Also tell them if you smoke, drink alcohol, or use illegal drugs. Some items may interact with your medicine. What should I watch for while using this medication? Visit your care team for regular checks on your progress. Tell your care team if your symptoms do not start to get better or if they get worse. This medication can make you more sensitive to the sun. Keep out of the sun. If you cannot avoid being in the sun, wear protective clothing and sunscreen. Do not use sun lamps or tanning beds/booths. What side effects may I notice from receiving this medication? Side effects that you should report to your care team as soon as possible: Allergic reactions--skin rash, itching, hives, swelling of the face, lips, tongue, or throat Burning, itching, crusting, or peeling of treated skin Side effects that usually do not require medical attention (report to your care team if they continue or are bothersome): Mild skin irritation, redness, or dryness This list may not describe all possible side effects. Call your doctor  for medical advice about side effects. You may report side effects to FDA at 1-800-FDA-1088. Where should I keep my medication? Keep out of the reach of children and pets. Store at room temperature between 20 and 25 degrees C (68 and 77 degrees F). Get rid of medications that are no longer needed or have expired: Take the medication to a medication take-back  program. Check with your pharmacy or law enforcement to find a location. If you cannot return the medication, check the label or package insert to see if the medication should be thrown out in the garbage or flushed down the toilet. If you are not sure, ask your care team. If it is safe to put in the trash, take the medication out of the container. Mix the medication with cat litter, dirt, coffee grounds, or other unwanted substance. Seal the mixture in a bag or container. Put it in the trash. NOTE: This sheet is a summary. It may not cover all possible information. If you have questions about this medicine, talk to your doctor, pharmacist, or health care provider.  2024 Elsevier/Gold Standard (2021-05-23 00:00:00)

## 2023-07-31 ENCOUNTER — Encounter: Payer: Self-pay | Admitting: Physician Assistant

## 2023-07-31 DIAGNOSIS — H2513 Age-related nuclear cataract, bilateral: Secondary | ICD-10-CM | POA: Diagnosis not present

## 2023-07-31 DIAGNOSIS — H52223 Regular astigmatism, bilateral: Secondary | ICD-10-CM | POA: Diagnosis not present

## 2023-07-31 DIAGNOSIS — H524 Presbyopia: Secondary | ICD-10-CM | POA: Diagnosis not present

## 2023-07-31 DIAGNOSIS — H31091 Other chorioretinal scars, right eye: Secondary | ICD-10-CM | POA: Diagnosis not present

## 2023-07-31 DIAGNOSIS — H5203 Hypermetropia, bilateral: Secondary | ICD-10-CM | POA: Diagnosis not present

## 2023-07-31 NOTE — Progress Notes (Signed)
 Acute Office Visit  Subjective:     Patient ID: Tanya [name], female    DOB: 1970/09/01, 53 y.o.   MRN: [id number]  Chief Complaint  Patient presents with   Hair/Scalp Problem    HPI Patient is in today for itchy scaly spot on the back of her head in her hairline. She has had before many months ago. It got better with clobetasol . Used tar shampoo and not really helped. It is very itchy. She is washing her hair every 3 days.   .. Active Ambulatory Problems    Diagnosis Date Noted   GERD 07/19/2010   Dermatitis 07/16/2012   Seasonal allergies 07/16/2012   Trigger thumb of left hand 04/17/2014   IDA (iron deficiency anemia) 07/04/2016   Syncope and collapse 09/09/2016   Trigger ring finger of left hand 10/19/2016   Anxiety 10/19/2016   Tinnitus of left ear 11/28/2016   Neck pain on left side 11/28/2016   Meralgia paraesthetica, left 06/05/2017   Episodic paroxysmal hemicrania, not intractable 09/06/2017   Class 2 obesity due to excess calories without serious comorbidity with body mass index (BMI) of 35.0 to 35.9 in adult 09/06/2017   Dyslipidemia (high LDL; low HDL) 09/22/2017   Skin lesion 01/27/2018   Impingement syndrome, shoulder, right 12/21/2019   Trigger thumb, right thumb 09/26/2020   No energy 05/27/2021   Post-menopausal 05/27/2021   Elevated blood pressure reading 05/27/2021   Microcytic anemia 05/28/2021   Low iron stores 06/12/2021   IFG (impaired fasting glucose) 06/12/2021   Family history of heart disease 06/12/2021   Hypertension 06/12/2021   Epidermal cyst of vulva 10/09/2021   Chronic right shoulder pain 12/12/2021   Suspicious nevus 09/22/2022   Nasal congestion 09/22/2022   Pre-diabetes 09/24/2022   Statin declined 09/24/2022   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     Objective:    BP 117/78   Pulse 77   Resp 14   Ht 5' 4 (1.626 m)   Wt 215 lb 6.4 oz (97.7 kg)   LMP  (LMP Unknown)   SpO2 94%   BMI 36.97 kg/m  BP Readings from Last 3 Encounters:  07/28/23 117/78  06/17/23 (!) 145/85  03/23/23 138/68   Wt Readings from Last 3 Encounters:  07/28/23 215 lb 6.4 oz (97.7 kg)  03/23/23 206 lb 12 oz (93.8 kg)  02/24/23 208 lb 8 oz (94.6 kg)      Physical Exam Constitutional:      Appearance: Normal appearance. She is obese.  Cardiovascular:     Rate and Rhythm: Normal rate.  Pulmonary:     Effort: Pulmonary effort is normal.  Skin:    Comments: Occipital scalp just in hair line quarter sized erythematous slightly raised scaly patch of skin.   Neurological:     General: No focal deficit present.     Mental Status: She is alert.  Psychiatric:        Mood and Affect: Mood normal.           Assessment & Plan:  [name] was seen today for hair/scalp problem.  Diagnoses and all orders for this visit:  Scalp psoriasis -     clobetasol  (TEMOVATE ) 0.05 % external solution; Apply 1 Application topically 2 (two) times daily.   ?  Eczema vs seb derm vs psoriasis but suspect psoriasis with the clear demarcation and previous benefit with clobetasol  Sent topical steroid  Discussed likely reoccurrence and prn usage Follow up as needed of if more itchy spots show up   [name], PA-C

## 2023-09-29 ENCOUNTER — Other Ambulatory Visit (HOSPITAL_COMMUNITY): Payer: Self-pay

## 2023-10-14 DIAGNOSIS — Z1231 Encounter for screening mammogram for malignant neoplasm of breast: Secondary | ICD-10-CM | POA: Diagnosis not present

## 2023-10-14 LAB — HM MAMMOGRAPHY

## 2023-10-16 ENCOUNTER — Encounter: Payer: Self-pay | Admitting: Physician Assistant

## 2024-01-06 ENCOUNTER — Other Ambulatory Visit (HOSPITAL_COMMUNITY): Payer: Self-pay

## 2024-03-29 ENCOUNTER — Encounter: Payer: Self-pay | Admitting: Sports Medicine

## 2024-04-04 ENCOUNTER — Ambulatory Visit: Admitting: Physician Assistant

## 2024-04-04 ENCOUNTER — Encounter: Payer: Self-pay | Admitting: Physician Assistant

## 2024-04-04 ENCOUNTER — Other Ambulatory Visit (HOSPITAL_COMMUNITY): Payer: Self-pay

## 2024-04-04 VITALS — BP 126/51 | HR 79 | Ht 64.0 in | Wt 214.0 lb

## 2024-04-04 DIAGNOSIS — D508 Other iron deficiency anemias: Secondary | ICD-10-CM | POA: Diagnosis not present

## 2024-04-04 DIAGNOSIS — L299 Pruritus, unspecified: Secondary | ICD-10-CM

## 2024-04-04 DIAGNOSIS — Z1211 Encounter for screening for malignant neoplasm of colon: Secondary | ICD-10-CM

## 2024-04-04 DIAGNOSIS — R7303 Prediabetes: Secondary | ICD-10-CM

## 2024-04-04 DIAGNOSIS — I10 Essential (primary) hypertension: Secondary | ICD-10-CM | POA: Diagnosis not present

## 2024-04-04 DIAGNOSIS — Z0000 Encounter for general adult medical examination without abnormal findings: Secondary | ICD-10-CM | POA: Diagnosis not present

## 2024-04-04 DIAGNOSIS — E785 Hyperlipidemia, unspecified: Secondary | ICD-10-CM

## 2024-04-04 MED ORDER — LOSARTAN POTASSIUM 50 MG PO TABS
50.0000 mg | ORAL_TABLET | Freq: Every day | ORAL | 3 refills | Status: AC
  Filled 2024-04-04: qty 90, 90d supply, fill #0
  Filled 2024-07-05: qty 90, 90d supply, fill #1

## 2024-04-04 MED ORDER — KETOCONAZOLE 2 % EX CREA
1.0000 | TOPICAL_CREAM | Freq: Two times a day (BID) | CUTANEOUS | 0 refills | Status: AC
  Filled 2024-04-04: qty 60, 30d supply, fill #0

## 2024-04-04 NOTE — Progress Notes (Deleted)
   Established Patient Office Visit  Subjective   Patient ID: JADELIN ENG, female    DOB: 27-Feb-1971  Age: 53 y.o. MRN: [id number]  No chief complaint on file.   HPI  {History (Optional):23778}  ROS    Objective:     LMP  (LMP Unknown)  {Vitals History (Optional):23777}  Physical Exam   No results found for any visits on 04/04/24.  {Labs (Optional):23779}  The 10-year ASCVD risk score (Arnett DK, et al., 2019) is: 3.1%    Assessment & Plan:   Problem List Items Addressed This Visit   None   No follow-ups on file.    Uliana Brinker, PA-C

## 2024-04-04 NOTE — Patient Instructions (Signed)

## 2024-04-08 NOTE — Progress Notes (Signed)
 Complete physical exam  Patient: Tanya [name]   DOB: 02-Feb-1971   53 y.o. Female  MRN: [id number]  Subjective:    Chief Complaint  Patient presents with   Medical Management of Chronic Issues    Rx refill    Tanya [name] is a 53 y.o. female who presents today for a complete physical exam. She reports consuming a general diet. The patient does not participate in regular exercise at present. She generally feels well. She reports sleeping well. She does not have additional problems to discuss today.    Most recent fall risk assessment:    04/04/2024    2:46 PM  Fall Risk   Falls in the past year? 0  Number falls in past yr: 0  Injury with Fall? 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   Pre-diabetes 09/24/2022   Statin declined 09/24/2022   Suspicious nevus 09/22/2022   Nasal congestion 09/22/2022   Chronic right shoulder pain 12/12/2021   Epidermal cyst of vulva 10/09/2021   Low iron stores 06/12/2021   IFG (impaired fasting glucose) 06/12/2021   Family history of heart disease 06/12/2021   Hypertension 06/12/2021   Microcytic anemia 05/28/2021   No energy 05/27/2021   Post-menopausal 05/27/2021   Elevated blood pressure reading 05/27/2021   Trigger thumb, right thumb 09/26/2020   Impingement syndrome, shoulder, right 12/21/2019   Skin lesion 01/27/2018   Dyslipidemia (high LDL; low HDL) 09/22/2017   Episodic paroxysmal hemicrania, not intractable 09/06/2017   Class 2 obesity due to excess calories without serious comorbidity with body mass index (BMI) of 35.0 to 35.9 in adult 09/06/2017   Meralgia paraesthetica, left 06/05/2017   Tinnitus of left ear 11/28/2016   Neck pain on  left side 11/28/2016   Trigger ring finger of left hand 10/19/2016   Anxiety 10/19/2016   Syncope and collapse 09/09/2016   IDA (iron deficiency anemia) 07/04/2016   Trigger thumb of left hand 04/17/2014   Dermatitis 07/16/2012   Seasonal allergies 07/16/2012   GERD 07/19/2010   Past Medical History:  Diagnosis Date   GERD (gastroesophageal reflux disease)    Hypertension    Syncope and collapse 09/09/2016   Past Surgical History:  Procedure Laterality Date   FINGER SURGERY     TENDON REPAIR     RT 4th digit   tri mallear     Family History  Problem Relation Age of Onset   Healthy Mother    Cancer Father        prostate   Heart attack Maternal Grandmother    Hypertension Maternal Grandmother    Parkinson's disease Maternal Grandfather    Allergies  Allergen Reactions   Other Other (See Comments)    Cats per pt       Patient Care Team: Jaiyana Canale L, PA-C as PCP - General (Family Medicine)   Outpatient Medications Prior to Visit  Medication Sig   b complex vitamins capsule Take 1 capsule by mouth daily.   CALCIUM PO Take by mouth in the morning and at bedtime.   cholecalciferol (VITAMIN D3) 25 MCG (1000 UNIT) tablet  Take 1,000 Units by mouth daily.   clobetasol  (TEMOVATE ) 0.05 % external solution Apply 1 Application topically 2 (two) times daily.   Ferrous Sulfate  (IRON PO) Take by mouth daily.   fexofenadine  (ALLEGRA  ALLERGY) 180 MG tablet Take 1 tablet (180 mg total) by mouth daily for 15 days.   fluticasone  (FLONASE ) 50 MCG/ACT nasal spray Place 2 sprays into both nostrils daily.   Multiple Vitamin tablet Take 2 tablets by mouth daily.   Multiple Vitamins-Minerals (ZINC PO) Take by mouth.   naproxen  sodium (ALEVE ) 220 MG tablet Take 220 mg by mouth daily as needed.   omeprazole (PRILOSEC OTC) 20 MG tablet Take 20 mg by mouth daily.   OVER THE COUNTER MEDICATION Thera-worx roll on   vitamin E 200 UNIT capsule Take 200 Units by mouth daily.    [DISCONTINUED] losartan  (COZAAR ) 50 MG tablet Take 1 tablet (50 mg total) by mouth daily.   Facility-Administered Medications Prior to Visit  Medication Dose Route Frequency Provider   gadopentetate dimeglumine  (MAGNEVIST ) injection 17 mL  17 mL Intravenous Once PRN Jenel Carlin POUR, MD    Review of Systems  All other systems reviewed and are negative.         Objective:     BP (!) 126/51   Pulse 79   Ht 5' 4 (1.626 m)   Wt 214 lb (97.1 kg)   LMP  (LMP Unknown)   SpO2 99%   BMI 36.73 kg/m  BP Readings from Last 3 Encounters:  04/04/24 (!) 126/51  07/28/23 117/78  06/17/23 (!) 145/85   Wt Readings from Last 3 Encounters:  04/04/24 214 lb (97.1 kg)  07/28/23 215 lb 6.4 oz (97.7 kg)  03/23/23 206 lb 12 oz (93.8 kg)      Physical Exam  BP (!) 126/51   Pulse 79   Ht 5' 4 (1.626 m)   Wt 214 lb (97.1 kg)   LMP  (LMP Unknown)   SpO2 99%   BMI 36.73 kg/m   General Appearance:    Alert, cooperative, obese no distress, appears stated age  Head:    Normocephalic, without obvious abnormality, atraumatic  Eyes:    PERRL, conjunctiva/corneas clear, EOM's intact, fundi    benign, both eyes  Ears:    Normal TM's bilaterally. Scaly and erythematous external ears, bilaterally.   Nose:   Nares normal, septum midline, mucosa normal, no drainage    or sinus tenderness  Throat:   Lips, mucosa, and tongue normal; teeth and gums normal  Neck:   Supple, symmetrical, trachea midline, no adenopathy;    thyroid:  no enlargement/tenderness/nodules; no carotid   bruit or JVD  Back:     Symmetric, no curvature, ROM normal, no CVA tenderness  Lungs:     Clear to auscultation bilaterally, respirations unlabored  Chest Wall:    No tenderness or deformity   Heart:    Regular rate and rhythm, S1 and S2 normal, no murmur, rub   or gallop     Abdomen:     Soft, non-tender, bowel sounds active all four quadrants,    no masses, no organomegaly        Extremities:   Extremities normal,  atraumatic, no cyanosis or edema  Pulses:   2+ and symmetric all extremities  Skin:   Skin color, texture, turgor normal, no rashes or lesions     Neurologic:   CNII-XII intact, normal strength, sensation and reflexes    throughout      Assessment &  Plan:    Routine Health Maintenance and Physical Exam  Immunization History  Administered Date(s) Administered   Influenza, Seasonal, Injecte, Preservative Fre 04/25/2019   Influenza-Unspecified 04/29/2016, 04/10/2017, 04/10/2018, 05/01/2021, 04/22/2022   PFIZER(Purple Top)SARS-COV-2 Vaccination 07/18/2019, 08/06/2019, 09/02/2020   PPD Test 04/20/2018   Tdap 11/28/2016    Health Maintenance  Topic Date Due   COVID-19 Vaccine (4 - 2025-26 season) 04/20/2024 (Originally 03/28/2024)   Zoster Vaccines- Shingrix (1 of 2) 07/04/2024 (Originally 10/06/1987)   Influenza Vaccine  10/25/2024 (Originally 02/26/2024)   Pneumococcal Vaccine: 50+ Years (1 of 1 - PCV) 04/04/2025 (Originally 10/06/2018)   Hepatitis B Vaccines 19-59 Average Risk (1 of 3 - 19+ 3-dose series) 04/04/2025 (Originally 10/06/1987)   HIV Screening  07/03/2026 (Originally 10/06/1983)   Fecal DNA (Cologuard)  06/09/2024   Mammogram  10/13/2025   DTaP/Tdap/Td (2 - Td or Tdap) 11/29/2026   Cervical Cancer Screening (HPV/Pap Cotest)  03/22/2028   Hepatitis C Screening  Completed   HPV VACCINES  Aged Out   Meningococcal B Vaccine  Aged Out    Discussed health benefits of physical activity, and encouraged her to engage in regular exercise appropriate for her age and condition. SABRARenata was seen today for medical management of chronic issues.  Diagnoses and all orders for this visit:  Routine physical examination -     Hemoglobin A1c -     CMP14+EGFR -     Lipid panel -     CBC w/Diff/Platelet -     VITAMIN D  25 Hydroxy (Vit-D Deficiency, Fractures)  Primary hypertension -     CMP14+EGFR -     losartan  (COZAAR ) 50 MG tablet; Take 1 tablet (50 mg total) by mouth daily.  Iron  deficiency anemia secondary to inadequate dietary iron intake -     CBC w/Diff/Platelet  Dyslipidemia (high LDL; low HDL) -     Lipid panel  Pre-diabetes -     Hemoglobin A1c  Ear itching -     ketoconazole  (NIZORAL ) 2 % cream; Apply 1 Application topically 2 (two) times daily to affected areas.  Colon cancer screening -     Ambulatory referral to Gastroenterology   .SABRA Discussed 150 minutes of exercise a week.  Encouraged vitamin D  1000 units and Calcium 1300mg  or 4 servings of dairy a day.  No falls PHQ no concerns Fasting labs ordered today BP improved on 2nd recheck, refilled losartan  Colonoscopy ordered Mammogram UTD Pap UTD Will get flu shot at work Declined covid and pneumonia vaccine  Ketoconazole  for likely seb derm of external ear  HO given for more information and OTC treatment as well   Return in about 6 months (around 10/02/2024).     Mignonne Afonso, PA-C

## 2024-04-11 ENCOUNTER — Encounter: Payer: Self-pay | Admitting: Physician Assistant

## 2024-05-16 ENCOUNTER — Ambulatory Visit: Payer: Self-pay

## 2024-05-16 NOTE — Telephone Encounter (Signed)
 FYI Only or Action Required?: Action required by provider: medication refill request.  Patient was last seen in primary care on 04/04/2024 by Antoniette Vermell CROME, PA-C.  Called Nurse Triage reporting Hemorrhoids.  Symptoms began several days ago.  Interventions attempted: Nothing.  Symptoms are: stable. Bleeding from hemorrhoid, no bleeding now.  Triage Disposition: See PCP Within 2 Weeks  Patient/caregiver understands and will follow disposition?: Yes     Copied from CRM [id number]. Topic: Clinical - Red Word Triage >> May 16, 2024 12:45 PM Mia F wrote: Red Word that prompted transfer to Nurse Triage: Bleeding hemorrhoid. Started bleeding Thursday and has not stopped. She says this is the first time it has bled. No pain Answer Assessment - Initial Assessment Questions 1. SYMPTOM:  What's the main symptom you're concerned about? (e.g., pain, itching, swelling, rash)     Hemorrhoid bleeding 2. ONSET: When did the   start?     Last Thursday 3. RECTAL PAIN: Do you have any pain around your rectum? How bad is the pain?  (Scale 0-10; or none, mild, moderate, severe)     no 4. RECTAL ITCHING: Do you have any itching in this area? How bad is the itching?  (Scale 0-10; or none, mild, moderate, severe)     no 5. CONSTIPATION: Do you have constipation? If Yes, ask: How often do you have a bowel movement (BM)?  (Normal range: 3 times a day to every 3 days)  When was your last BM?       no 6. CAUSE: What do you think is causing the anus symptoms?     hemorrhoid 7. OTHER SYMPTOMS: Do you have any other symptoms?  (e.g., abdomen pain, fever, rectal bleeding, vomiting)     no 8. PREGNANCY: Is there any chance you are pregnant? When was your last menstrual period?     no  Protocols used: Rectal Symptoms-A-AH  Reason for Disposition  [1] Recurrent episodes of unexplained rectal pain AND [2] NO rectal symptoms now  Answer Assessment - Initial Assessment Questions 1.  SYMPTOM:  What's the main symptom you're concerned about? (e.g., pain, itching, swelling, rash)     Hemorrhoid bleeding 2. ONSET: When did the   start?     Last Thursday 3. RECTAL PAIN: Do you have any pain around your rectum? How bad is the pain?  (Scale 0-10; or none, mild, moderate, severe)     no 4. RECTAL ITCHING: Do you have any itching in this area? How bad is the itching?  (Scale 0-10; or none, mild, moderate, severe)     no 5. CONSTIPATION: Do you have constipation? If Yes, ask: How often do you have a bowel movement (BM)?  (Normal range: 3 times a day to every 3 days)  When was your last BM?       no 6. CAUSE: What do you think is causing the anus symptoms?     hemorrhoid 7. OTHER SYMPTOMS: Do you have any other symptoms?  (e.g., abdomen pain, fever, rectal bleeding, vomiting)     no 8. PREGNANCY: Is there any chance you are pregnant? When was your last menstrual period?     no  Protocols used: Rectal Symptoms-A-AH

## 2024-05-17 ENCOUNTER — Ambulatory Visit: Admitting: Physician Assistant

## 2024-05-17 VITALS — BP 139/63 | HR 73 | Ht 64.0 in | Wt 214.0 lb

## 2024-05-17 DIAGNOSIS — Z1211 Encounter for screening for malignant neoplasm of colon: Secondary | ICD-10-CM | POA: Diagnosis not present

## 2024-05-17 DIAGNOSIS — R195 Other fecal abnormalities: Secondary | ICD-10-CM | POA: Diagnosis not present

## 2024-05-17 DIAGNOSIS — K625 Hemorrhage of anus and rectum: Secondary | ICD-10-CM | POA: Diagnosis not present

## 2024-05-17 DIAGNOSIS — K644 Residual hemorrhoidal skin tags: Secondary | ICD-10-CM | POA: Insufficient documentation

## 2024-05-17 LAB — HEMOCCULT GUIAC POC 1CARD (OFFICE): Fecal Occult Blood, POC: POSITIVE — AB

## 2024-05-17 NOTE — Patient Instructions (Signed)
 Will make referral again to GI.

## 2024-05-17 NOTE — Progress Notes (Unsigned)
 Acute Office Visit  Subjective:     Patient ID: Tanya [name], female    DOB: 1970-11-02, 53 y.o.   MRN: [id number]  Chief Complaint  Patient presents with   Medical Management of Chronic Issues    Patient is a 53 yo female presenting with the complaint of anal bleeding since 10/16. She states she noticed a hemorrhoid at on her rectum which begin to bleed on 10/17. The bleeding is bright red, and has been consistent when she wears a sanitary pad. There is no pain associated with the bleeding. She states her bowel movements are normal and she has 1-3 soft bowel movements a day. She denies diarrhea, pain during bowel movements, abdominal pain, nausea, and vomiting.   Pt also states she has not received a call to set up her appointment for a colonoscopy which will be due in November.   Review of Systems  Gastrointestinal:  Positive for blood in stool. Negative for abdominal pain, constipation, diarrhea, melena, nausea and vomiting.       Patient states there is noticeable blood on the tissue after using the restroom which seems to be coming from the rectum.   Genitourinary:  Negative for dysuria, hematuria and urgency.       LMP: 3-4 years ago  All other systems reviewed and are negative.       Objective:    BP 139/63   Pulse 73   Ht 5' 4 (1.626 m)   Wt 97.1 kg   LMP  (LMP Unknown)   SpO2 99%   BMI 36.73 kg/m  {Vitals History (Optional):23777}  Physical Exam Exam conducted with a chaperone present (Patient asked to lay in lateral decubitus position.).  Constitutional:      Appearance: Normal appearance. She is obese.  HENT:     Head: Normocephalic and atraumatic.  Cardiovascular:     Rate and Rhythm: Normal rate and regular rhythm.     Pulses: Normal pulses.     Heart sounds: Normal heart sounds.  Pulmonary:     Effort: Pulmonary effort is normal.     Breath sounds: Normal breath sounds.  Genitourinary:    Rectum: Guaiac result positive.     Comments: External  hemorrhoid noted on the rectum which may have bled out from increased pressure.  Neurological:     General: No focal deficit present.     Mental Status: She is alert and oriented to person, place, and time.    .. Results for orders placed or performed in visit on 05/17/24 (from the past 24 hours)  POCT Occult Blood Stool     Status: Abnormal   Collection Time: 05/17/24  4:49 PM  Result Value Ref Range   Fecal Occult Blood, POC Positive (A) Negative   Card #1 Date     Card #2 Fecal Occult Blod, POC     Card #2 Date     Card #3 Fecal Occult Blood, POC     Card #3 Date     Narrative   Hemoccult showed minimal spots of blood within the stool sample.          Assessment & Plan:   Problem List Items Addressed This Visit       Cardiovascular and Mediastinum   External hemorrhoid - Primary  Use hemorrhoid cream as directed   Consider adding more water and fiber to the diet   Can use a Sitz bath for recurring hemorrhoids        Digestive  Bright red blood per rectum     Use hemorrhoid cream for bleeding as directed      Referral to GI placed for more urgent colonoscopy due to rectal bleeding     If bleeding increases or pain becomes too intense, please contact the office.   No orders of the defined types were placed in this encounter.   No follow-ups on file.  Destine K Riggins, Student-PA

## 2024-05-18 ENCOUNTER — Encounter: Payer: Self-pay | Admitting: Physician Assistant

## 2024-05-18 DIAGNOSIS — K644 Residual hemorrhoidal skin tags: Secondary | ICD-10-CM | POA: Insufficient documentation

## 2024-05-27 ENCOUNTER — Encounter: Payer: Self-pay | Admitting: Physician Assistant

## 2024-06-06 ENCOUNTER — Other Ambulatory Visit (HOSPITAL_COMMUNITY): Payer: Self-pay

## 2024-06-06 MED ORDER — NA SULFATE-K SULFATE-MG SULF 17.5-3.13-1.6 GM/177ML PO SOLN
177.0000 mL | Freq: Two times a day (BID) | ORAL | 0 refills | Status: AC
  Filled 2024-06-06: qty 354, 1d supply, fill #0

## 2024-06-07 ENCOUNTER — Other Ambulatory Visit (HOSPITAL_COMMUNITY): Payer: Self-pay

## 2024-06-09 DIAGNOSIS — K6289 Other specified diseases of anus and rectum: Secondary | ICD-10-CM | POA: Diagnosis not present

## 2024-06-09 DIAGNOSIS — Z1211 Encounter for screening for malignant neoplasm of colon: Secondary | ICD-10-CM | POA: Diagnosis not present

## 2024-06-09 DIAGNOSIS — K635 Polyp of colon: Secondary | ICD-10-CM | POA: Diagnosis not present

## 2024-07-05 ENCOUNTER — Other Ambulatory Visit (HOSPITAL_COMMUNITY): Payer: Self-pay

## 2024-10-03 ENCOUNTER — Ambulatory Visit: Admitting: Physician Assistant
